# Patient Record
Sex: Female | Born: 1955 | Race: White | Hispanic: No | Marital: Single | Smoking: Current every day smoker
Health system: Southern US, Community
[De-identification: ages and names within clinical notes are randomized; demographics above are authoritative.]

## PROBLEM LIST (undated history)

## (undated) DIAGNOSIS — K219 Gastro-esophageal reflux disease without esophagitis: Secondary | ICD-10-CM

## (undated) DIAGNOSIS — F419 Anxiety disorder, unspecified: Secondary | ICD-10-CM

## (undated) DIAGNOSIS — F1020 Alcohol dependence, uncomplicated: Secondary | ICD-10-CM

## (undated) DIAGNOSIS — F32A Depression, unspecified: Secondary | ICD-10-CM

## (undated) DIAGNOSIS — F329 Major depressive disorder, single episode, unspecified: Secondary | ICD-10-CM

## (undated) HISTORY — PX: TUBAL LIGATION: SHX77

---

## 2013-07-20 ENCOUNTER — Emergency Department: Payer: Self-pay | Admitting: Emergency Medicine

## 2013-07-20 LAB — URINALYSIS, COMPLETE
Bacteria: NONE SEEN
Bilirubin,UR: NEGATIVE
Blood: NEGATIVE
Glucose,UR: NEGATIVE mg/dL (ref 0–75)
Ketone: NEGATIVE
Leukocyte Esterase: NEGATIVE
NITRITE: NEGATIVE
Ph: 8 (ref 4.5–8.0)
Protein: NEGATIVE
RBC, UR: NONE SEEN /HPF (ref 0–5)
SPECIFIC GRAVITY: 1.004 (ref 1.003–1.030)
Squamous Epithelial: <1
WBC UR: NONE SEEN /HPF (ref 0–5)

## 2013-07-20 LAB — BASIC METABOLIC PANEL
Anion Gap: 8 (ref 7–16)
BUN: 6 mg/dL — ABNORMAL LOW (ref 7–18)
CHLORIDE: 96 mmol/L — AB (ref 98–107)
CO2: 29 mmol/L (ref 21–32)
CREATININE: 0.4 mg/dL — AB (ref 0.60–1.30)
Calcium, Total: 9.5 mg/dL (ref 8.5–10.1)
EGFR (Non-African Amer.): >60
GLUCOSE: 78 mg/dL (ref 65–99)
Osmolality: 263 (ref 275–301)
POTASSIUM: 3.8 mmol/L (ref 3.5–5.1)
SODIUM: 133 mmol/L — AB (ref 136–145)

## 2013-07-20 LAB — LIPASE, BLOOD: Lipase: 214 U/L (ref 73–393)

## 2013-07-20 LAB — CBC
HCT: 45 % (ref 35.0–47.0)
HGB: 15.2 g/dL (ref 12.0–16.0)
MCH: 31.4 pg (ref 26.0–34.0)
MCHC: 33.8 g/dL (ref 32.0–36.0)
MCV: 93 fL (ref 80–100)
PLATELETS: 212 10*3/uL (ref 150–440)
RBC: 4.84 10*6/uL (ref 3.80–5.20)
RDW: 12.8 % (ref 11.5–14.5)
WBC: 5.4 10*3/uL (ref 3.6–11.0)

## 2013-07-20 LAB — HEPATIC FUNCTION PANEL A (ARMC)
ALK PHOS: 74 U/L
AST: 31 U/L (ref 15–37)
Albumin: 4.2 g/dL (ref 3.4–5.0)
Bilirubin, Direct: 0.1 mg/dL (ref 0.00–0.20)
Bilirubin,Total: 0.4 mg/dL (ref 0.2–1.0)
SGPT (ALT): 25 U/L (ref 12–78)
Total Protein: 7.1 g/dL (ref 6.4–8.2)

## 2013-07-20 LAB — TROPONIN I: Troponin-I: <0.02 ng/mL

## 2013-09-01 ENCOUNTER — Encounter (HOSPITAL_COMMUNITY): Payer: Self-pay | Admitting: Emergency Medicine

## 2013-09-01 DIAGNOSIS — F1020 Alcohol dependence, uncomplicated: Secondary | ICD-10-CM | POA: Insufficient documentation

## 2013-09-01 DIAGNOSIS — F1010 Alcohol abuse, uncomplicated: Secondary | ICD-10-CM | POA: Insufficient documentation

## 2013-09-01 DIAGNOSIS — F17200 Nicotine dependence, unspecified, uncomplicated: Secondary | ICD-10-CM | POA: Insufficient documentation

## 2013-09-01 NOTE — ED Notes (Signed)
Pt requesting detox from etoh.  Last drank etoh on the way to the hospital.  Denies suicidal ideation.  States she has not ate in the last 3 days.

## 2013-09-02 ENCOUNTER — Emergency Department (HOSPITAL_COMMUNITY)
Admission: EM | Admit: 2013-09-02 | Discharge: 2013-09-02 | Disposition: A | Discharge status: Other Acute Inpatient Hospital | Payer: Managed Care, Other (non HMO) | Attending: Emergency Medicine | Admitting: Emergency Medicine

## 2013-09-02 ENCOUNTER — Encounter (HOSPITAL_COMMUNITY): Payer: Self-pay

## 2013-09-02 ENCOUNTER — Inpatient Hospital Stay (HOSPITAL_COMMUNITY)
Admission: AD | Admit: 2013-09-02 | Discharge: 2013-09-05 | DRG: 897 | Disposition: A | Discharge status: Home / Self Care | Payer: Managed Care, Other (non HMO) | Source: Intra-hospital | Attending: Psychiatry | Admitting: Psychiatry

## 2013-09-02 DIAGNOSIS — K219 Gastro-esophageal reflux disease without esophagitis: Secondary | ICD-10-CM | POA: Diagnosis present

## 2013-09-02 DIAGNOSIS — F1020 Alcohol dependence, uncomplicated: Secondary | ICD-10-CM

## 2013-09-02 DIAGNOSIS — F10929 Alcohol use, unspecified with intoxication, unspecified: Secondary | ICD-10-CM

## 2013-09-02 DIAGNOSIS — F1010 Alcohol abuse, uncomplicated: Secondary | ICD-10-CM | POA: Diagnosis present

## 2013-09-02 DIAGNOSIS — F329 Major depressive disorder, single episode, unspecified: Secondary | ICD-10-CM | POA: Diagnosis present

## 2013-09-02 DIAGNOSIS — G4700 Insomnia, unspecified: Secondary | ICD-10-CM | POA: Diagnosis present

## 2013-09-02 DIAGNOSIS — F1994 Other psychoactive substance use, unspecified with psychoactive substance-induced mood disorder: Secondary | ICD-10-CM | POA: Diagnosis present

## 2013-09-02 DIAGNOSIS — F17200 Nicotine dependence, unspecified, uncomplicated: Secondary | ICD-10-CM | POA: Diagnosis present

## 2013-09-02 DIAGNOSIS — F411 Generalized anxiety disorder: Secondary | ICD-10-CM | POA: Diagnosis present

## 2013-09-02 LAB — COMPREHENSIVE METABOLIC PANEL
ALK PHOS: 87 U/L (ref 39–117)
ALT: 23 U/L (ref 0–35)
AST: 40 U/L — ABNORMAL HIGH (ref 0–37)
Albumin: 4.5 g/dL (ref 3.5–5.2)
BUN: 4 mg/dL — ABNORMAL LOW (ref 6–23)
CO2: 22 meq/L (ref 19–32)
Calcium: 9.7 mg/dL (ref 8.4–10.5)
Chloride: 88 mEq/L — ABNORMAL LOW (ref 96–112)
Creatinine, Ser: 0.5 mg/dL (ref 0.50–1.10)
GLUCOSE: 98 mg/dL (ref 70–99)
POTASSIUM: 4.1 meq/L (ref 3.7–5.3)
Sodium: 130 mEq/L — ABNORMAL LOW (ref 137–147)
TOTAL PROTEIN: 7.7 g/dL (ref 6.0–8.3)
Total Bilirubin: 0.4 mg/dL (ref 0.3–1.2)

## 2013-09-02 LAB — RAPID URINE DRUG SCREEN, HOSP PERFORMED
Amphetamines: NOT DETECTED
Barbiturates: NOT DETECTED
Benzodiazepines: NOT DETECTED
COCAINE: NOT DETECTED
Opiates: NOT DETECTED
TETRAHYDROCANNABINOL: NOT DETECTED

## 2013-09-02 LAB — CBC
HCT: 42.3 % (ref 36.0–46.0)
HEMOGLOBIN: 15.2 g/dL — AB (ref 12.0–15.0)
MCH: 32.2 pg (ref 26.0–34.0)
MCHC: 35.9 g/dL (ref 30.0–36.0)
MCV: 89.6 fL (ref 78.0–100.0)
PLATELETS: 242 10*3/uL (ref 150–400)
RBC: 4.72 MIL/uL (ref 3.87–5.11)
RDW: 12.5 % (ref 11.5–15.5)
WBC: 5.7 10*3/uL (ref 4.0–10.5)

## 2013-09-02 LAB — SALICYLATE LEVEL: Salicylate Lvl: <2 mg/dL — ABNORMAL LOW (ref 2.8–20.0)

## 2013-09-02 LAB — ACETAMINOPHEN LEVEL

## 2013-09-02 LAB — ETHANOL: Alcohol, Ethyl (B): 263 mg/dL — ABNORMAL HIGH (ref 0–11)

## 2013-09-02 MED ORDER — CHLORDIAZEPOXIDE HCL 25 MG PO CAPS
25.0000 mg | ORAL_CAPSULE | ORAL | Status: AC
  Administered 2013-09-04 – 2013-09-05 (×2): 25 mg via ORAL
  Filled 2013-09-02 (×2): qty 1

## 2013-09-02 MED ORDER — CHLORDIAZEPOXIDE HCL 25 MG PO CAPS
25.0000 mg | ORAL_CAPSULE | Freq: Three times a day (TID) | ORAL | Status: AC
  Administered 2013-09-03 – 2013-09-04 (×3): 25 mg via ORAL
  Filled 2013-09-02 (×3): qty 1

## 2013-09-02 MED ORDER — TRAZODONE HCL 50 MG PO TABS: 50.0000 mg | ORAL_TABLET | Freq: Every evening | ORAL | Status: DC | PRN

## 2013-09-02 MED ORDER — SUCRALFATE 1 G PO TABS
1.0000 g | ORAL_TABLET | Freq: Three times a day (TID) | ORAL | Status: DC | PRN
  Filled 2013-09-02: qty 1

## 2013-09-02 MED ORDER — NICOTINE 21 MG/24HR TD PT24: 21.0000 mg | MEDICATED_PATCH | Freq: Every day | TRANSDERMAL | Status: DC

## 2013-09-02 MED ORDER — PANTOPRAZOLE SODIUM 40 MG PO TBEC: 40.0000 mg | DELAYED_RELEASE_TABLET | Freq: Every day | ORAL | Status: DC | PRN

## 2013-09-02 MED ORDER — ALUM & MAG HYDROXIDE-SIMETH 200-200-20 MG/5ML PO SUSP: 30.0000 mL | ORAL | Status: DC | PRN

## 2013-09-02 MED ORDER — ONDANSETRON HCL 4 MG PO TABS: 4.0000 mg | ORAL_TABLET | Freq: Three times a day (TID) | ORAL | Status: DC | PRN

## 2013-09-02 MED ORDER — MAGNESIUM HYDROXIDE 400 MG/5ML PO SUSP: 30.0000 mL | Freq: Every day | ORAL | Status: DC | PRN

## 2013-09-02 MED ORDER — LORAZEPAM 1 MG PO TABS: 0.0000 mg | ORAL_TABLET | Freq: Two times a day (BID) | ORAL | Status: DC

## 2013-09-02 MED ORDER — PANTOPRAZOLE SODIUM 40 MG PO TBEC
40.0000 mg | DELAYED_RELEASE_TABLET | Freq: Every day | ORAL | Status: DC | PRN
  Administered 2013-09-02: 40 mg via ORAL
  Filled 2013-09-02: qty 1

## 2013-09-02 MED ORDER — ONDANSETRON 4 MG PO TBDP
4.0000 mg | ORAL_TABLET | Freq: Four times a day (QID) | ORAL | Status: AC | PRN
  Administered 2013-09-02: 4 mg via ORAL
  Filled 2013-09-02: qty 1

## 2013-09-02 MED ORDER — VITAMIN B-1 100 MG PO TABS
100.0000 mg | ORAL_TABLET | Freq: Every day | ORAL | Status: DC
  Administered 2013-09-03 – 2013-09-05 (×3): 100 mg via ORAL
  Filled 2013-09-02 (×5): qty 1

## 2013-09-02 MED ORDER — GABAPENTIN 100 MG PO CAPS
100.0000 mg | ORAL_CAPSULE | Freq: Three times a day (TID) | ORAL | Status: DC
  Administered 2013-09-02 – 2013-09-05 (×10): 100 mg via ORAL
  Filled 2013-09-02 (×12): qty 1
  Filled 2013-09-02: qty 12
  Filled 2013-09-02: qty 1
  Filled 2013-09-02 (×2): qty 12
  Filled 2013-09-02: qty 1

## 2013-09-02 MED ORDER — ADULT MULTIVITAMIN W/MINERALS CH
1.0000 | ORAL_TABLET | Freq: Every day | ORAL | Status: DC
  Administered 2013-09-02 – 2013-09-05 (×4): 1 via ORAL
  Filled 2013-09-02 (×6): qty 1

## 2013-09-02 MED ORDER — ACETAMINOPHEN 325 MG PO TABS
650.0000 mg | ORAL_TABLET | Freq: Four times a day (QID) | ORAL | Status: DC | PRN
Start: 2013-09-02 — End: 2013-09-05

## 2013-09-02 MED ORDER — THIAMINE HCL 100 MG/ML IJ SOLN: 100.0000 mg | Freq: Every day | INTRAMUSCULAR | Status: DC

## 2013-09-02 MED ORDER — METHOCARBAMOL 500 MG PO TABS
500.0000 mg | ORAL_TABLET | Freq: Three times a day (TID) | ORAL | Status: AC
  Administered 2013-09-02 – 2013-09-05 (×9): 500 mg via ORAL
  Filled 2013-09-02 (×11): qty 1

## 2013-09-02 MED ORDER — THIAMINE HCL 100 MG/ML IJ SOLN: 100.0000 mg | Freq: Once | INTRAMUSCULAR | Status: DC

## 2013-09-02 MED ORDER — HYDROXYZINE HCL 25 MG PO TABS
25.0000 mg | ORAL_TABLET | Freq: Four times a day (QID) | ORAL | Status: AC | PRN
  Administered 2013-09-02 – 2013-09-03 (×2): 25 mg via ORAL
  Filled 2013-09-02 (×2): qty 1

## 2013-09-02 MED ORDER — LORAZEPAM 1 MG PO TABS
0.0000 mg | ORAL_TABLET | Freq: Four times a day (QID) | ORAL | Status: DC
  Administered 2013-09-02: 1 mg via ORAL
  Filled 2013-09-02: qty 1

## 2013-09-02 MED ORDER — LOPERAMIDE HCL 2 MG PO CAPS: 2.0000 mg | ORAL_CAPSULE | ORAL | Status: AC | PRN

## 2013-09-02 MED ORDER — CHLORDIAZEPOXIDE HCL 25 MG PO CAPS: 25.0000 mg | ORAL_CAPSULE | Freq: Every day | ORAL | Status: DC

## 2013-09-02 MED ORDER — TETRAHYDROZOLINE HCL 0.05 % OP SOLN
1.0000 [drp] | Freq: Two times a day (BID) | OPHTHALMIC | Status: DC
  Administered 2013-09-03 – 2013-09-05 (×5): 1 [drp] via OPHTHALMIC
  Filled 2013-09-02: qty 15

## 2013-09-02 MED ORDER — ZOLPIDEM TARTRATE 5 MG PO TABS: 5.0000 mg | ORAL_TABLET | Freq: Every evening | ORAL | Status: DC | PRN

## 2013-09-02 MED ORDER — TRAZODONE HCL 50 MG PO TABS
75.0000 mg | ORAL_TABLET | Freq: Every day | ORAL | Status: DC
  Administered 2013-09-02 – 2013-09-03 (×2): 75 mg via ORAL
  Filled 2013-09-02 (×8): qty 1

## 2013-09-02 MED ORDER — NICOTINE 21 MG/24HR TD PT24
21.0000 mg | MEDICATED_PATCH | Freq: Every day | TRANSDERMAL | Status: DC
  Administered 2013-09-03 – 2013-09-05 (×3): 21 mg via TRANSDERMAL
  Filled 2013-09-02 (×6): qty 1

## 2013-09-02 MED ORDER — CHLORDIAZEPOXIDE HCL 25 MG PO CAPS
25.0000 mg | ORAL_CAPSULE | Freq: Four times a day (QID) | ORAL | Status: AC | PRN
  Administered 2013-09-02 – 2013-09-03 (×3): 25 mg via ORAL
  Filled 2013-09-02 (×4): qty 1

## 2013-09-02 MED ORDER — IBUPROFEN 400 MG PO TABS: 600.0000 mg | ORAL_TABLET | Freq: Three times a day (TID) | ORAL | Status: DC | PRN

## 2013-09-02 MED ORDER — SODIUM CHLORIDE 0.9 % IV BOLUS (SEPSIS)
1000.0000 mL | Freq: Once | INTRAVENOUS | Status: AC
  Administered 2013-09-02: 1000 mL via INTRAVENOUS

## 2013-09-02 MED ORDER — VITAMIN B-1 100 MG PO TABS: 100.0000 mg | ORAL_TABLET | Freq: Every day | ORAL | Status: DC

## 2013-09-02 MED ORDER — CHLORDIAZEPOXIDE HCL 25 MG PO CAPS
25.0000 mg | ORAL_CAPSULE | Freq: Four times a day (QID) | ORAL | Status: AC
  Administered 2013-09-02 – 2013-09-03 (×4): 25 mg via ORAL
  Filled 2013-09-02 (×4): qty 1
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
## 2013-09-02 NOTE — Progress Notes (Signed)
Psychoeducational Group Note  Date:  09/02/2013 Time:  1315  Group Topic/Focus:  Identifying Needs:   The focus of this group is to help patients identify their personal needs that have been historically problematic and identify healthy behaviors to address their needs.  Participation Level:  Did Not Attend   Migdalia Dk 09/02/2013,3:26 PM

## 2013-09-02 NOTE — Progress Notes (Signed)
D.  Pt pleasant on approach, anxious affect.  Requested medication for sleep.  Positive for evening wrap up group, interacting appropriately with peers on unit.  Denies SI/HI/hallucinations at this time.  A.  Support and encouragement offered, medication given as ordered for insomnia and withdrawal symptoms.  R.  Pt remains safe on unit, will continue to monitor.

## 2013-09-02 NOTE — BH Assessment (Signed)
Assessment completed. Consulted with Serena Colonel who recommends inpatient treatment. Pt has been accepted to [facility] 300 Bed 2. Hazel Sams, PA-C has been notified of the recommendation and acceptance to [facility]. Pt can be transported after 7:30 am.

## 2013-09-02 NOTE — Progress Notes (Signed)
Pt is a 58 year old female admitted with ETOH dependence and some depression without suicidal ideation   She requests detox and said she is unable to stop drinking on her own  However she has had 3 to 4 months of sobriety before   She presents as sad but cooperative  She is having some stomach discomfort and some tremors with increased anxiety   She lives in an RV parked in her daughters yard and can go back there   She refused her pneumonia shot but said she woud think about getting it  And refused her thiamine shot     Pt was admitted to the 300 hall and oriented to the unit  She was offered nourishment  Medications administered and effectiveness monitored   Will continue to evaluate her withdrawal symptoms and educate on same   Provided verbal support and encouragement   Q 15 min checks explained and implemented   Pt is presently safe

## 2013-09-02 NOTE — Progress Notes (Signed)
Adult Psychoeducational Group Note  Date:  09/02/2013 Time:  3:40 PM  Group Topic/Focus:  Therapeutic Activity   Participation Level:  Active  Participation Quality:  Appropriate, Sharing and Supportive  Affect:  Appropriate  Cognitive:  Appropriate  Insight: Appropriate  Engagement in Group:  Engaged  Modes of Intervention:  Activity  Elisha Headland 09/02/2013, 3:40 PM

## 2013-09-02 NOTE — ED Notes (Signed)
Pt states last drink of beer was last night. States she "was drinking too much it was turning her life to shit so she wanted to turn her life around"

## 2013-09-02 NOTE — ED Provider Notes (Signed)
CSN: [id number]     Arrival date & time 09/01/13  2329 History   First MD Initiated Contact with Patient 09/02/13 0148     Chief Complaint  Patient presents with  . Alcohol Problem   HPI  History provided by the patient and daughter. The patient is a 58 year old female with history of alcohol abuse presenting with request for help with alcohol detox. Patient is a heavy daily drinker mostly beer. She reports that previously undergoing detox and alcohol addiction therapy 2-3 years ago fall was only sober for a few months. She denies any other drug use. She reports having poor appetite over the past several days with occasional episodes of vomiting. She denies any past history of significant withdrawal symptoms or hospitalizations. Denies any recent illnesses. No recent injuries or falls. She denies any SI or HI.    History reviewed. No pertinent past medical history. Past Surgical History  Procedure Laterality Date  . Tubal ligation     No family history on file. History  Substance Use Topics  . Smoking status: Current Every Day Smoker  . Smokeless tobacco: Not on file  . Alcohol Use: Yes   OB History   Grav Para Term Preterm Abortions TAB SAB Ect Mult Living                 Review of Systems  All other systems reviewed and are negative.     Allergies  Compazine  Home Medications   Prior to Admission medications   Medication Sig Start Date End Date Taking? Authorizing Provider  pantoprazole (PROTONIX) 40 MG tablet Take 40 mg by mouth daily as needed (acid reflux).   Yes Historical Provider, MD  sucralfate (CARAFATE) 1 G tablet Take 1 g by mouth 3 (three) times daily as needed (acid).   Yes Historical Provider, MD  Tetrahydrozoline HCl (VISINE OP) Place 2 drops into both eyes daily as needed (only on working days).   Yes Historical Provider, MD   BP 137/82  Pulse 88  Temp(Src) 97.9 F (36.6 C) (Oral)  Resp 12  Ht 5\' 4"  (1.626 m)  Wt 120 lb (54.432 kg)  BMI 20.59  kg/m2  SpO2 96% Physical Exam  Nursing note and vitals reviewed. Constitutional: She is oriented to person, place, and time. She appears well-developed and well-nourished. No distress.  HENT:  Head: Normocephalic.  Cardiovascular: Normal rate and regular rhythm.   Pulmonary/Chest: Effort normal and breath sounds normal. No respiratory distress.  Abdominal: Soft.  Musculoskeletal: Normal range of motion.  Neurological: She is alert and oriented to person, place, and time.  Skin: Skin is warm and dry. No rash noted.  Psychiatric: She has a normal mood and affect. Her behavior is normal.    ED Course  Procedures   COORDINATION OF CARE:  Nursing notes reviewed. Vital signs reviewed. Initial pt interview and examination performed.   Filed Vitals:   09/01/13 2340 09/02/13 0108 09/02/13 0139  BP: 167/88 143/83 137/82  Pulse: 113  88  Temp: 97.6 F (36.4 C) 97.9 F (36.6 C)   TempSrc: Oral Oral   Resp: 20 12   Height: 5\' 4"  (1.626 m)    Weight: 120 lb (54.432 kg)    SpO2: 98% 96%     1:59 AM-patient seen and evaluated. She is well-appearing in no acute distress.  Patient appears slightly dehydrated. IV fluids given. Laboratory testing otherwise on concerning. Patient is medically cleared and able for further psychiatric and addiction treatment.   Patient  was assessed by [facility]TS and has been accepted at [facility] under Dr. Sabra Heck. Patient stable for transfer at this time.   Results for orders placed during the hospital encounter of 09/02/13  ACETAMINOPHEN LEVEL      Result Value Ref Range   Acetaminophen (Tylenol), Serum <15.0  10 - 30 ug/mL  CBC      Result Value Ref Range   WBC 5.7  4.0 - 10.5 K/uL   RBC 4.72  3.87 - 5.11 MIL/uL   Hemoglobin 15.2 (*) 12.0 - 15.0 g/dL   HCT 42.3  36.0 - 46.0 %   MCV 89.6  78.0 - 100.0 fL   MCH 32.2  26.0 - 34.0 pg   MCHC 35.9  30.0 - 36.0 g/dL   RDW 12.5  11.5 - 15.5 %   Platelets 242  150 - 400 K/uL  COMPREHENSIVE METABOLIC PANEL      Result  Value Ref Range   Sodium 130 (*) 137 - 147 mEq/L   Potassium 4.1  3.7 - 5.3 mEq/L   Chloride 88 (*) 96 - 112 mEq/L   CO2 22  19 - 32 mEq/L   Glucose, Bld 98  70 - 99 mg/dL   BUN 4 (*) 6 - 23 mg/dL   Creatinine, Ser 0.50  0.50 - 1.10 mg/dL   Calcium 9.7  8.4 - 10.5 mg/dL   Total Protein 7.7  6.0 - 8.3 g/dL   Albumin 4.5  3.5 - 5.2 g/dL   AST 40 (*) 0 - 37 U/L   ALT 23  0 - 35 U/L   Alkaline Phosphatase 87  39 - 117 U/L   Total Bilirubin 0.4  0.3 - 1.2 mg/dL   GFR calc non Af Amer >90  >90 mL/min   GFR calc Af Amer >90  >90 mL/min  ETHANOL      Result Value Ref Range   Alcohol, Ethyl (B) 263 (*) 0 - 11 mg/dL  SALICYLATE LEVEL      Result Value Ref Range   Salicylate Lvl <1.6 (*) 2.8 - 20.0 mg/dL  URINE RAPID DRUG SCREEN (HOSP PERFORMED)      Result Value Ref Range   Opiates NONE DETECTED  NONE DETECTED   Cocaine NONE DETECTED  NONE DETECTED   Benzodiazepines NONE DETECTED  NONE DETECTED   Amphetamines NONE DETECTED  NONE DETECTED   Tetrahydrocannabinol NONE DETECTED  NONE DETECTED   Barbiturates NONE DETECTED  NONE DETECTED    MDM   Final diagnoses:  Alcohol addiction  Alcohol intoxication       Martie Lee, PA-C 09/02/13 [phone]

## 2013-09-02 NOTE — ED Provider Notes (Signed)
Medical screening examination/treatment/procedure(s) were performed by non-physician practitioner and as supervising physician I was immediately available for consultation/collaboration.   EKG Interpretation None       Kalman Drape, MD 09/02/13 [phone]

## 2013-09-02 NOTE — [facility] Suicide Risk Assessment (Signed)
   Nursing information obtained from:    Demographic factors:    Current Mental Status:    Loss Factors:    Historical Factors:    Risk Reduction Factors:    Total Time spent with patient: 20 minutes  CLINICAL FACTORS:   Severe Anxiety and/or Agitation Alcohol/Substance Abuse/Dependencies Unstable or Poor Therapeutic Relationship  Psychiatric Specialty Exam: Physical Exam  Psychiatric: She has a normal mood and affect. Her speech is normal and behavior is normal. Thought content normal. Cognition and memory are normal. She expresses impulsivity.    Review of Systems  Constitutional: Positive for malaise/fatigue and diaphoresis.  HENT: Negative.   Eyes: Negative.   Respiratory: Negative.   Cardiovascular: Negative.   Gastrointestinal: Positive for nausea.  Genitourinary: Negative.   Musculoskeletal: Negative.   Skin: Negative.   Neurological: Positive for tremors and weakness.  Endo/Heme/Allergies: Negative.   Psychiatric/Behavioral: Positive for substance abuse. The patient is nervous/anxious and has insomnia.     Blood pressure 133/77, pulse 134, temperature 99.4 F (37.4 C), temperature source Oral, resp. rate 18, height 5\' 4"  (1.626 m), weight 56.246 kg (124 lb).Body mass index is 21.27 kg/(m^2).  General Appearance: Fairly Groomed  Engineer, water::  Good  Speech:  Clear and Coherent and Normal Rate  Volume:  Normal  Mood:  Anxious  Affect:  anxious  Thought Process:  Goal Directed  Orientation:  Full (Time, Place, and Person)  Thought Content:  Negative  Suicidal Thoughts:  No  Homicidal Thoughts:  No  Memory:  Immediate;   Fair Recent;   Fair Remote;   Fair  Judgement:  Impaired  Insight:  Shallow  Psychomotor Activity:  Decreased  Concentration:  Fair  Recall:  Good  Fund of Knowledge:Good  Language: Good  Akathisia:  No  Handed:  Right  AIMS (if indicated):     Assets:  Communication Skills Desire for Improvement Physical Health  Sleep:       Musculoskeletal: Strength & Muscle Tone: within normal limits Gait & Station: normal Patient leans: N/A  COGNITIVE FEATURES THAT CONTRIBUTE TO RISK:  Closed-mindedness    SUICIDE RISK:   Minimal: No identifiable suicidal ideation.  Patients presenting with no risk factors but with morbid ruminations; may be classified as minimal risk based on the severity of the depressive symptoms  PLAN OF CARE:1. Admit for crisis management and stabilization. 2. Medication management to reduce current symptoms to base line and improve the     patient's overall level of functioning 3. Treat health problems as indicated. 4. Develop treatment plan to decrease risk of relapse upon discharge and the need for     readmission. 5. Psycho-social education regarding relapse prevention and self care. 6. Health care follow up as needed for medical problems. 7. Restart home medications where appropriate.   I certify that inpatient services furnished can reasonably be expected to improve the patient's condition.  Corena Pilgrim, MD 09/02/2013, 10:06 AM

## 2013-09-02 NOTE — BH Assessment (Signed)
Spoke with Hazel Sams, PA-C who stated that the patient is requesting alcohol detox. Pt is denying SI, HI, AH and VH at this time. No history of seizures have been reported.

## 2013-09-02 NOTE — H&P (Signed)
Psychiatric Admission Assessment Adult  Patient Identification:  Brandi Gonzales  Date of Evaluation:  09/02/2013  Chief Complaint:  ETOH INTOXICATION  History of Present Illness: Brandi Gonzales is 58 year old. Caucasian female. Admitted from the [facility]. She reports, "My daughter took me to the hospital because I have been drinking too much alcohol. I can do 6-8 cans of beer everyday. I have been doing this for a long time. I'm an alcoholic. I drink because of high stress levels, both personal and occupational. Everything about my life is stressful. Alcohol calms me when tensed. My longest sobriety was 6 months, that was 3 years ago. I relapsed because of stress".  Brandi Gonzales is alert and about. She appears older than stated age. She says she is highly anxious, tired, nauseated and having muscle cramps today. She asked for help with alcoholism. It is affecting her life in a negative way.  Elements:  Location:  Alcohol dependence. Quality:  Fatigue, nausea, abdominal cramps, muscle cramps. Severity:  Severe, "I drink a lot of alcohol everyday". Timing:  "Drinking heavily in the last 3 years". Duration:  Chronic. Context:  "A lot of personal stress, stress from work, alcohol calms me".  Associated Signs/Synptoms:  Depression Symptoms:  depressed mood, insomnia, feelings of worthlessness/guilt, hopelessness, anxiety,  (Hypo) Manic Symptoms:  Impulsivity,  Anxiety Symptoms:  Excessive Worry,  Psychotic Symptoms:  Denies  PTSD Symptoms: NA  Psychiatric Specialty Exam: Physical Exam  Constitutional: She is oriented to person, place, and time. She appears well-developed.  HENT:  Head: Normocephalic.  Eyes: Pupils are equal, round, and reactive to light.  Neck: Normal range of motion.  Cardiovascular: Normal rate.   Respiratory: Effort normal.  GI: Soft.  Genitourinary:  Denies any intent and or plans  Musculoskeletal: Normal range of motion.  Neurological: She is alert and  oriented to person, place, and time.  Skin: Skin is warm and dry.    Review of Systems  Constitutional: Positive for chills, malaise/fatigue and diaphoresis.  HENT: Negative.   Eyes: Negative.   Respiratory: Negative.   Cardiovascular: Negative.   Gastrointestinal: Positive for nausea and abdominal pain.  Genitourinary: Negative.   Skin: Negative.   Neurological: Positive for dizziness, tremors and weakness.  Endo/Heme/Allergies: Negative.   Psychiatric/Behavioral: Negative for depression, suicidal ideas, hallucinations and memory loss. Substance abuse: Alcoholism, chronic. The patient is nervous/anxious and has insomnia.     There were no vitals taken for this visit.There is no weight on file to calculate BMI.  General Appearance: Disheveled  Eye Sport and exercise psychologist::  Fair  Speech:  Clear and Coherent  Volume:  Normal  Mood:  Anxious, Depressed and Hopeless  Affect:  Flat  Thought Process:  Coherent and Goal Directed  Orientation:  Full (Time, Place, and Person)  Thought Content:  Denies any hallucinations or delusions  Suicidal Thoughts:  No  Homicidal Thoughts:  No  Memory:  Immediate;   Fair Recent;   Fair Remote;   Fair  Judgement:  Fair  Insight:  Present  Psychomotor Activity:  Restlessness and Tremor  Concentration:  Fair  Recall:  Good  Fund of Knowledge:Fair  Language: Fair  Akathisia:  No  Handed:  Right  AIMS (if indicated):     Assets:  Communication Skills Desire for Improvement  Sleep:      Musculoskeletal: Strength & Muscle Tone: within normal limits Gait & Station: normal Patient leans: N/A  Past Psychiatric History: Diagnosis: Alcohol dependence  Hospitalizations: [facility] adult unit  Outpatient Care: With  Dr. Ovid Curd, says "I don't care much about this guy"  Substance Abuse Care: In [facility] a long time ago  Self-Mutilation: Denies  Suicidal Attempts: Denies  Violent Behaviors: Denies   Past Medical History:  No past medical history on  file. None.  Allergies:   Allergies  Allergen Reactions  . Compazine [Prochlorperazine] Other (See Comments)    Makes eyes roll into the back of head   PTA Medications: Prescriptions prior to admission  Medication Sig Dispense Refill  . pantoprazole (PROTONIX) 40 MG tablet Take 40 mg by mouth daily as needed (acid reflux).      . sucralfate (CARAFATE) 1 G tablet Take 1 g by mouth 3 (three) times daily as needed (acid).      . Tetrahydrozoline HCl (VISINE OP) Place 2 drops into both eyes daily as needed (only on working days).        Previous Psychotropic Medications:  Medication/Dose  See medication lists               Substance Abuse History in the last 12 months:  yes  Consequences of Substance Abuse: Medical Consequences:  Liver damage, Possible death by overdose Legal Consequences:  Arrests, jail time, Loss of driving privilege. Family Consequences:  Family discord, divorce and or separation.  Social History:  reports that she has been smoking.  She does not have any smokeless tobacco history on file. She reports that she drinks alcohol. She reports that she does not use illicit drugs. 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  Legal History: Denies any pending legal charges  Hobbies/Interests: NA  Family History:  No family history on file.  Results for orders placed during the hospital encounter of 09/02/13 (from the past 72 hour(s))  ACETAMINOPHEN LEVEL     Status: None   Collection Time    09/02/13 12:15 AM      Result Value Ref Range    Acetaminophen (Tylenol), Serum <15.0  10 - 30 ug/mL   Comment:            THERAPEUTIC CONCENTRATIONS VARY     SIGNIFICANTLY. A RANGE OF 10-30     ug/mL MAY BE AN EFFECTIVE     CONCENTRATION FOR MANY PATIENTS.     HOWEVER, SOME ARE BEST TREATED     AT CONCENTRATIONS OUTSIDE THIS     RANGE.     ACETAMINOPHEN CONCENTRATIONS     >150 ug/mL AT 4 HOURS AFTER     INGESTION AND >50 ug/mL AT 12     HOURS AFTER INGESTION ARE     OFTEN ASSOCIATED WITH TOXIC     REACTIONS.  CBC     Status: Abnormal   Collection Time    09/02/13 12:15 AM      Result Value Ref Range   WBC 5.7  4.0 - 10.5 K/uL   RBC 4.72  3.87 - 5.11 MIL/uL   Hemoglobin 15.2 (*) 12.0 - 15.0 g/dL   HCT 42.3  36.0 - 46.0 %   MCV 89.6  78.0 - 100.0 fL   MCH 32.2  26.0 - 34.0 pg   MCHC 35.9  30.0 -  36.0 g/dL   RDW 12.5  11.5 - 15.5 %   Platelets 242  150 - 400 K/uL  COMPREHENSIVE METABOLIC PANEL     Status: Abnormal   Collection Time    09/02/13 12:15 AM      Result Value Ref Range   Sodium 130 (*) 137 - 147 mEq/L   Potassium 4.1  3.7 - 5.3 mEq/L   Chloride 88 (*) 96 - 112 mEq/L   CO2 22  19 - 32 mEq/L   Glucose, Bld 98  70 - 99 mg/dL   BUN 4 (*) 6 - 23 mg/dL   Creatinine, Ser 0.50  0.50 - 1.10 mg/dL   Calcium 9.7  8.4 - 10.5 mg/dL   Total Protein 7.7  6.0 - 8.3 g/dL   Albumin 4.5  3.5 - 5.2 g/dL   AST 40 (*) 0 - 37 U/L   ALT 23  0 - 35 U/L   Alkaline Phosphatase 87  39 - 117 U/L   Total Bilirubin 0.4  0.3 - 1.2 mg/dL   GFR calc non Af Amer >90  >90 mL/min   GFR calc Af Amer >90  >90 mL/min   Comment: (NOTE)     The eGFR has been calculated using the CKD EPI equation.     This calculation has not been validated in all clinical situations.     eGFR's persistently <90 mL/min signify possible Chronic Kidney     Disease.  ETHANOL     Status: Abnormal   Collection Time    09/02/13 12:15 AM      Result Value Ref Range   Alcohol, Ethyl (B) 263 (*) 0 - 11 mg/dL   Comment:            LOWEST DETECTABLE LIMIT FOR      SERUM ALCOHOL IS 11 mg/dL     FOR MEDICAL PURPOSES ONLY  SALICYLATE LEVEL     Status: Abnormal   Collection Time    09/02/13 12:15 AM      Result Value Ref Range   Salicylate Lvl <6.5 (*) 2.8 - 20.0 mg/dL  URINE RAPID DRUG SCREEN (HOSP PERFORMED)     Status: None   Collection Time    09/02/13  1:17 AM      Result Value Ref Range   Opiates NONE DETECTED  NONE DETECTED   Cocaine NONE DETECTED  NONE DETECTED   Benzodiazepines NONE DETECTED  NONE DETECTED   Amphetamines NONE DETECTED  NONE DETECTED   Tetrahydrocannabinol NONE DETECTED  NONE DETECTED   Barbiturates NONE DETECTED  NONE DETECTED   Comment:            DRUG SCREEN FOR MEDICAL PURPOSES     ONLY.  IF CONFIRMATION IS NEEDED     FOR ANY PURPOSE, NOTIFY LAB     WITHIN 5 DAYS.                LOWEST DETECTABLE LIMITS     FOR URINE DRUG SCREEN     Drug Class       Cutoff (ng/mL)     Amphetamine      1000     Barbiturate      200     Benzodiazepine   465     Tricyclics       035     Opiates          300     Cocaine          300  THC              50   Psychological Evaluations:  Assessment:   DSM5: Schizophrenia Disorders:  NA Obsessive-Compulsive Disorders:  NA Trauma-Stressor Disorders:  NA Substance/Addictive Disorders:  Alcohol Related Disorder - Severe (303.90) Depressive Disorders:  Substance induced mood disorder  AXIS I:  Alcohol dependence  AXIS II:  Deferred  AXIS III:  No past medical history on file.  AXIS IV:  other psychosocial or environmental problems and Alcoholism, chronic  AXIS V:  1-10 persistent dangerousness to self and others present  Treatment Plan/Recommendations: 1. Admit for crisis management and stabilization, estimated length of stay 3-5 days.  2. Medication management to reduce current symptoms to base line and improve the patient's overall level of functioning; continue Librium detox protocols already in progress. Will add Robaxin 500 mg tid x 3 days for muscle cramps, Neurontin  100 mg tid for substance withdrawal sydrome. 3. Treat health problems as indicated.  4. Develop treatment plan to decrease risk of relapse upon discharge and the need for readmission.  5. Psycho-social education regarding relapse prevention and self care.  6. Health care follow up as needed for medical problems.  7. Review, reconcile, and reinstate any pertinent home medications for other health issues where appropriate. 8. Call for consults with hospitalist for any additional specialty patient care services as needed.  Treatment Plan Summary: Daily contact with patient to assess and evaluate symptoms and progress in treatment  Current Medications:  Current Facility-Administered Medications  Medication Dose Route Frequency Provider Last Rate Last Dose  . acetaminophen (TYLENOL) tablet 650 mg  650 mg Oral Q6H PRN Encarnacion Slates, NP      . alum & mag hydroxide-simeth (MAALOX/MYLANTA) 200-200-20 MG/5ML suspension 30 mL  30 mL Oral Q4H PRN Encarnacion Slates, NP      . chlordiazePOXIDE (LIBRIUM) capsule 25 mg  25 mg Oral Q6H PRN Encarnacion Slates, NP      . chlordiazePOXIDE (LIBRIUM) capsule 25 mg  25 mg Oral QID Encarnacion Slates, NP       Followed by  . [START ON 09/03/2013] chlordiazePOXIDE (LIBRIUM) capsule 25 mg  25 mg Oral TID Encarnacion Slates, NP       Followed by  . [START ON 09/04/2013] chlordiazePOXIDE (LIBRIUM) capsule 25 mg  25 mg Oral BH-qamhs Encarnacion Slates, NP       Followed by  . [START ON 09/06/2013] chlordiazePOXIDE (LIBRIUM) capsule 25 mg  25 mg Oral Daily Encarnacion Slates, NP      . hydrOXYzine (ATARAX/VISTARIL) tablet 25 mg  25 mg Oral Q6H PRN Encarnacion Slates, NP      . loperamide (IMODIUM) capsule 2-4 mg  2-4 mg Oral PRN Encarnacion Slates, NP      . magnesium hydroxide (MILK OF MAGNESIA) suspension 30 mL  30 mL Oral Daily PRN Encarnacion Slates, NP      . multivitamin with minerals tablet 1 tablet  1 tablet Oral Daily Encarnacion Slates, NP      . ondansetron (ZOFRAN-ODT) disintegrating tablet 4 mg  4 mg Oral  Q6H PRN Encarnacion Slates, NP      . pantoprazole (PROTONIX) EC tablet 40 mg  40 mg Oral Daily PRN Encarnacion Slates, NP      . tetrahydrozoline 0.05 % ophthalmic solution 1 drop  1 drop Both Eyes BID Encarnacion Slates, NP      . thiamine (B-1) injection 100 mg  100 mg Intramuscular Once Encarnacion Slates, NP      . Derrill Memo ON 09/03/2013] thiamine (VITAMIN B-1) tablet 100 mg  100 mg Oral Daily Encarnacion Slates, NP      . traZODone (DESYREL) tablet 50 mg  50 mg Oral QHS PRN Encarnacion Slates, NP        Observation Level/Precautions:  15 minute checks  Laboratory:  Per ED  Psychotherapy: Group sessions   Medications: See medication lists   Consultations: As needed   Discharge Concerns: Maintaining sobriety   Estimated LOS: 2-4 days   Other:     I certify that inpatient services furnished can reasonably be expected to improve the patient's condition.   Encarnacion Slates, PMHNP-BC 6/13/20159:48 AM   Patient seen, evaluated and I agree with notes by Nurse Practitioner. Corena Pilgrim, MD

## 2013-09-02 NOTE — [facility] Group Notes (Signed)
[facility] Group Notes: (Clinical Social Work)   09/02/2013      Type of Therapy:  Group Therapy   Participation Level:  Did Not Attend    Selmer Dominion, LCSW 09/02/2013, 12:09 PM

## 2013-09-02 NOTE — ED Notes (Signed)
Pt has been placed in blue scrubs and security paged for wanding

## 2013-09-02 NOTE — Tx Team (Addendum)
Initial Interdisciplinary Treatment Plan  PATIENT STRENGTHS: (choose at least two) Average or above average intelligence Capable of independent living General fund of knowledge Supportive family/friends  PATIENT STRESSORS: Medication change or noncompliance Substance abuse   PROBLEM LIST: Problem List/Patient Goals Date to be addressed Date deferred Reason deferred Estimated date of resolution  ETOH Dependence                                                       DISCHARGE CRITERIA:  Ability to meet basic life and health needs Improved stabilization in mood, thinking, and/or behavior Need for constant or close observation no longer present Withdrawal symptoms are absent or subacute and managed without 24-hour nursing intervention  PRELIMINARY DISCHARGE PLAN: Attend aftercare/continuing care group Attend 12-step recovery group Return to previous living arrangement  PATIENT/FAMIILY INVOLVEMENT: This treatment plan has been presented to and reviewed with the patient, Kaveri Perras, and/or family member, .  The patient and family have been given the opportunity to ask questions and make suggestions.  Migdalia Dk 09/02/2013, 10:14 AM

## 2013-09-03 MED ORDER — PANTOPRAZOLE SODIUM 40 MG PO TBEC
40.0000 mg | DELAYED_RELEASE_TABLET | Freq: Every day | ORAL | Status: DC
  Administered 2013-09-04 – 2013-09-05 (×2): 40 mg via ORAL
  Filled 2013-09-03: qty 1
  Filled 2013-09-03: qty 4
  Filled 2013-09-03 (×2): qty 1

## 2013-09-03 MED ORDER — VITAMIN B-1 100 MG PO TABS
ORAL_TABLET | ORAL | Status: AC
  Filled 2013-09-03: qty 1

## 2013-09-03 NOTE — [facility] Group Notes (Signed)
[facility] Group Notes:  Life skills  Date:  09/03/2013  Time:  2:45 PM  Type of Therapy:  Nurse Education  Participation Level:  Active  Participation Quality:  Appropriate  Affect:  Appropriate  Cognitive:  Alert  Insight:  Appropriate  Engagement in Group:  Engaged  Modes of Intervention:  Discussion  Summary of Progress/Problems:  Delman Kitten 09/03/2013, 2:45 PM

## 2013-09-03 NOTE — Progress Notes (Signed)
Adult Psychoeducational Group Note  Date:  09/03/2013 Time:  6:45 PM  Group Topic/Focus:  Therapeutic activity   Participation Level:  Active  Participation Quality:  Appropriate  Affect:  Appropriate  Cognitive:  Appropriate  Insight: Appropriate  Engagement in Group:  Engaged  Modes of Intervention:  Discussion  Additional Comments:  Pt attended group this afternoon. Pt was appropriate in group.   Yuvaan Olander A 09/03/2013, 6:45 PM

## 2013-09-03 NOTE — Progress Notes (Signed)
Pt attended AA group this evening.  

## 2013-09-03 NOTE — Progress Notes (Signed)
[facility] MD Progress Note  09/03/2013 1:32 PM [facility]  MRN:  [id number] Subjective:   Patient states "I was drinking quite a bit. It got to where I needed a drink before work. I have been having withdrawal symptoms. The sheets were soaked last night. I want to go to rehab. I am very worried about my job. I have not been taking care of myself. Look at these red places on my face. I know it is from the drinking."  Objective:  The patient is visible on the unit and attending the scheduled groups. She reports multiple withdrawal symptoms from chronic alcohol abuse. Rates her depression at five today. She is worried about the impact that drinking has had on her health. Patient has been compliant with scheduled medications and unit rules. Thea has been receiving prn medications such as zofran to help with the withdrawal symptoms.   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  AXIS IV: other psychosocial or environmental problems and Alcoholism, chronic  AXIS V: 30-40  ADL's:  Intact  Sleep: Fair  Appetite:  Fair  Suicidal Ideation:  Denies Homicidal Ideation:  Denies AEB (as evidenced by):  Psychiatric Specialty Exam: Physical Exam  Review of Systems  Constitutional: Positive for malaise/fatigue.  Eyes: Negative.   Respiratory: Negative.   Cardiovascular: Negative.   Gastrointestinal: Positive for heartburn and nausea.  Genitourinary: Negative.   Musculoskeletal: Negative.   Skin: Negative.   Neurological: Positive for tremors and headaches.  Endo/Heme/Allergies: Negative.   Psychiatric/Behavioral: Positive for depression and substance abuse. The  patient is nervous/anxious.     Blood pressure 100/69, pulse 96, temperature 98.6 F (37 C), temperature source Oral, resp. rate 20, height 5' 4"  (1.626 m), weight 56.246 kg (124 lb).Body mass index is 21.27 kg/(m^2).  General Appearance: Casual  Eye Contact::  Fair  Speech:  Clear and Coherent  Volume:  Normal  Mood:  Anxious and Depressed  Affect:  Flat  Thought Process:  Coherent and Goal Directed  Orientation:  Full (Time, Place, and Person)  Thought Content:  Rumination  Suicidal Thoughts:  No  Homicidal Thoughts:  No  Memory:  Immediate;   Fair Recent;   Fair Remote;   Fair  Judgement:  Fair  Insight:  Present  Psychomotor Activity:  Restlessness and Tremor  Concentration:  Fair  Recall:  Good  Fund of Knowledge:Fair  Language: Fair  Akathisia:  No  Handed:  Right  AIMS (if indicated):     Assets:  Communication Skills Desire for Improvement Resilience Social Support  Sleep:  Number of Hours: 6.25   Musculoskeletal: Strength & Muscle Tone: within normal limits Gait & Station: normal Patient leans: N/A  Current Medications: Current Facility-Administered Medications  Medication Dose Route Frequency Provider Last Rate Last Dose  . acetaminophen (TYLENOL) tablet 650 mg  650 mg Oral Q6H PRN Encarnacion Slates, NP      . alum & mag hydroxide-simeth (MAALOX/MYLANTA) 200-200-20 MG/5ML suspension 30 mL  30 mL Oral Q4H PRN Encarnacion Slates, NP      . chlordiazePOXIDE (LIBRIUM) capsule 25 mg  25 mg Oral Q6H PRN Encarnacion Slates, NP   25 mg at 09/03/13 4332  . chlordiazePOXIDE (LIBRIUM) capsule  25 mg  25 mg Oral TID Encarnacion Slates, NP   25 mg at 09/03/13 1200   Followed by  . [START ON 09/04/2013] chlordiazePOXIDE (LIBRIUM) capsule 25 mg  25 mg Oral BH-qamhs Encarnacion Slates, NP       Followed by  . [START ON 09/06/2013] chlordiazePOXIDE (LIBRIUM) capsule 25 mg  25 mg Oral Daily Encarnacion Slates, NP      . gabapentin (NEURONTIN) capsule 100 mg  100 mg Oral TID Encarnacion Slates, NP   100 mg at  09/03/13 1200  . hydrOXYzine (ATARAX/VISTARIL) tablet 25 mg  25 mg Oral Q6H PRN Encarnacion Slates, NP   25 mg at 09/02/13 2129  . loperamide (IMODIUM) capsule 2-4 mg  2-4 mg Oral PRN Encarnacion Slates, NP      . magnesium hydroxide (MILK OF MAGNESIA) suspension 30 mL  30 mL Oral Daily PRN Encarnacion Slates, NP      . methocarbamol (ROBAXIN) tablet 500 mg  500 mg Oral TID Encarnacion Slates, NP   500 mg at 09/03/13 1200  . multivitamin with minerals tablet 1 tablet  1 tablet Oral Daily Encarnacion Slates, NP   1 tablet at 09/03/13 0820  . nicotine (NICODERM CQ - dosed in mg/24 hours) patch 21 mg  21 mg Transdermal Daily Nicholaus Bloom, MD   21 mg at 09/03/13 0820  . ondansetron (ZOFRAN-ODT) disintegrating tablet 4 mg  4 mg Oral Q6H PRN Encarnacion Slates, NP   4 mg at 09/02/13 1041  . pantoprazole (PROTONIX) EC tablet 40 mg  40 mg Oral Daily PRN Encarnacion Slates, NP   40 mg at 09/02/13 1039  . tetrahydrozoline 0.05 % ophthalmic solution 1 drop  1 drop Both Eyes BID Encarnacion Slates, NP   1 drop at 09/03/13 0141  . thiamine (B-1) injection 100 mg  100 mg Intramuscular Once Encarnacion Slates, NP      . thiamine (VITAMIN B-1) 100 MG tablet           . thiamine (VITAMIN B-1) tablet 100 mg  100 mg Oral Daily Encarnacion Slates, NP   100 mg at 09/03/13 0824  . traZODone (DESYREL) tablet 75 mg  75 mg Oral QHS Donn Wilmot   75 mg at 09/02/13 2129    Lab Results:  Results for orders placed during the hospital encounter of 09/02/13 (from the past 48 hour(s))  ACETAMINOPHEN LEVEL     Status: None   Collection Time    09/02/13 12:15 AM      Result Value Ref Range   Acetaminophen (Tylenol), Serum <15.0  10 - 30 ug/mL   Comment:            THERAPEUTIC CONCENTRATIONS VARY     SIGNIFICANTLY. A RANGE OF 10-30     ug/mL MAY BE AN EFFECTIVE     CONCENTRATION FOR MANY PATIENTS.     HOWEVER, SOME ARE BEST TREATED     AT CONCENTRATIONS OUTSIDE THIS     RANGE.     ACETAMINOPHEN CONCENTRATIONS     >150 ug/mL AT 4 HOURS AFTER     INGESTION AND >50  ug/mL AT 12     HOURS AFTER INGESTION ARE     OFTEN ASSOCIATED WITH TOXIC     REACTIONS.  CBC     Status: Abnormal   Collection Time    09/02/13 12:15 AM      Result Value Ref Range  WBC 5.7  4.0 - 10.5 K/uL   RBC 4.72  3.87 - 5.11 MIL/uL   Hemoglobin 15.2 (*) 12.0 - 15.0 g/dL   HCT 42.3  36.0 - 46.0 %   MCV 89.6  78.0 - 100.0 fL   MCH 32.2  26.0 - 34.0 pg   MCHC 35.9  30.0 - 36.0 g/dL   RDW 12.5  11.5 - 15.5 %   Platelets 242  150 - 400 K/uL  COMPREHENSIVE METABOLIC PANEL     Status: Abnormal   Collection Time    09/02/13 12:15 AM      Result Value Ref Range   Sodium 130 (*) 137 - 147 mEq/L   Potassium 4.1  3.7 - 5.3 mEq/L   Chloride 88 (*) 96 - 112 mEq/L   CO2 22  19 - 32 mEq/L   Glucose, Bld 98  70 - 99 mg/dL   BUN 4 (*) 6 - 23 mg/dL   Creatinine, Ser 0.50  0.50 - 1.10 mg/dL   Calcium 9.7  8.4 - 10.5 mg/dL   Total Protein 7.7  6.0 - 8.3 g/dL   Albumin 4.5  3.5 - 5.2 g/dL   AST 40 (*) 0 - 37 U/L   ALT 23  0 - 35 U/L   Alkaline Phosphatase 87  39 - 117 U/L   Total Bilirubin 0.4  0.3 - 1.2 mg/dL   GFR calc non Af Amer >90  >90 mL/min   GFR calc Af Amer >90  >90 mL/min   Comment: (NOTE)     The eGFR has been calculated using the CKD EPI equation.     This calculation has not been validated in all clinical situations.     eGFR's persistently <90 mL/min signify possible Chronic Kidney     Disease.  ETHANOL     Status: Abnormal   Collection Time    09/02/13 12:15 AM      Result Value Ref Range   Alcohol, Ethyl (B) 263 (*) 0 - 11 mg/dL   Comment:            LOWEST DETECTABLE LIMIT FOR     SERUM ALCOHOL IS 11 mg/dL     FOR MEDICAL PURPOSES ONLY  SALICYLATE LEVEL     Status: Abnormal   Collection Time    09/02/13 12:15 AM      Result Value Ref Range   Salicylate Lvl <6.3 (*) 2.8 - 20.0 mg/dL  URINE RAPID DRUG SCREEN (HOSP PERFORMED)     Status: None   Collection Time    09/02/13  1:17 AM      Result Value Ref Range   Opiates NONE DETECTED  NONE DETECTED    Cocaine NONE DETECTED  NONE DETECTED   Benzodiazepines NONE DETECTED  NONE DETECTED   Amphetamines NONE DETECTED  NONE DETECTED   Tetrahydrocannabinol NONE DETECTED  NONE DETECTED   Barbiturates NONE DETECTED  NONE DETECTED   Comment:            DRUG SCREEN FOR MEDICAL PURPOSES     ONLY.  IF CONFIRMATION IS NEEDED     FOR ANY PURPOSE, NOTIFY LAB     WITHIN 5 DAYS.                LOWEST DETECTABLE LIMITS     FOR URINE DRUG SCREEN     Drug Class       Cutoff (ng/mL)     Amphetamine      1000  Barbiturate      200     Benzodiazepine   600     Tricyclics       459     Opiates          300     Cocaine          300     THC              50    Physical Findings: AIMS: Facial and Oral Movements Muscles of Facial Expression: None, normal Lips and Perioral Area: None, normal Jaw: None, normal Tongue: None, normal,Extremity Movements Upper (arms, wrists, hands, fingers): None, normal Lower (legs, knees, ankles, toes): None, normal, Trunk Movements Neck, shoulders, hips: None, normal, Overall Severity Severity of abnormal movements (highest score from questions above): None, normal Incapacitation due to abnormal movements: None, normal Patient's awareness of abnormal movements (rate only patient's report): No Awareness, Dental Status Current problems with teeth and/or dentures?: No Does patient usually wear dentures?: No  CIWA:  CIWA-Ar Total: 0 COWS:     Treatment Plan Summary: Daily contact with patient to assess and evaluate symptoms and progress in treatment Medication management  Plan: 1. Continue crisis management and stabilization.  2.Medication management:  -Continue Librium protocol for alcohol detox.  -Continue Neurontin 100 mg TID for anxiety/improved mood stability -Continue Trazodone 75 mg hs for insomnia.  3. Encouraged patient to attend groups and participate in group counseling sessions and activities.  4. Discharge plan in progress.  5. Continue current  treatment plan.  6. Address health issues: Vitals reviewed and stable. Change Protonix to 40 mg daily instead of prn acid reflux.   Medical Decision Making Problem Points:  Established problem, stable/improving (1), Review of last therapy session (1) and Review of psycho-social stressors (1) Data Points:  Review or order clinical lab tests (1) Review of medication regiment & side effects (2) Review of new medications or change in dosage (2)  I certify that inpatient services furnished can reasonably be expected to improve the patient's condition.   DAVIS, LAURA NP-C 09/03/2013, 1:32 PM  Patient seen, evaluated and I agree with notes by Nurse Practitioner. Corena Pilgrim, MD

## 2013-09-03 NOTE — BHH Group Notes (Signed)
[facility] Group Notes:  pscysocial group notes  Date:  09/03/2013  Time:  9:54 AM  Type of Therapy:  Psychoeducational Skills  Participation Level:  Active  Participation Quality:  Appropriate  Affect:  Appropriate  Cognitive:  Alert  Insight:  Appropriate  Engagement in Group:  Engaged  Modes of Intervention:  Discussion  Summary of Progress/Problems:pt states she has a strong family network and is very involved in church when she can go. Pt realizes she is not alone.   Marcello Moores [facility] 09/03/2013, 9:54 AM

## 2013-09-03 NOTE — Progress Notes (Signed)
Pt states her depression is a 5/10 and her hopelessness is a 5/10. Pt would like to wash her hair today and have her legs. Tech made aware that this is the pts goal. Her main complaint is night sweats. She would like to eat better, drink healthy drinks and continue treatment. Pt denies Si and HI and contracts for safety. Pt has been attending groups.

## 2013-09-03 NOTE — BHH Group Notes (Signed)
[facility] Group Notes:  (Clinical Social Work)  09/03/2013  10:00-11:00AM  Summary of Progress/Problems:   The main focus of today's process group was to   identify the patient's current support system and decide on other supports that can be put in place.  The picture on workbook was used to discuss why additional supports are needed.  An emphasis was placed on using counselor, doctor, therapy groups, 12-step groups, and problem-specific support groups to expand supports.   There was also an extensive discussion about what constitutes a healthy support versus an unhealthy support.  The patient was very participatory.  She talked about going to rehab in [facility] and making friends that are still friends, but who have relapsed.  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  This eventually led to her relapse.  She was then called out to see another staff member.  Type of Therapy:  Process Group with Motivational Interviewing  Participation Level:  Active  Participation Quality:  Attentive and Sharing  Affect:  Blunted  Cognitive:  Alert  Insight:  Engaged  Engagement in Therapy:  Engaged  Modes of Intervention:   Education, Support and Processing, Activity  [employer], LCSW 09/03/2013, 12:15pm

## 2013-09-03 NOTE — Progress Notes (Signed)
D. Pt anxious on approach, complaint of withdrawal symptoms.  Positive for evening AA group, interacting appropriately with peers on unit.  Denies SI/HI/hallucinations at this time.  A.  Support and encouragement offered, medications given as ordered for withdrawal  R.  Pt remains safe on unit, will continue to monitor.

## 2013-09-04 DIAGNOSIS — F329 Major depressive disorder, single episode, unspecified: Secondary | ICD-10-CM | POA: Diagnosis present

## 2013-09-04 DIAGNOSIS — F1020 Alcohol dependence, uncomplicated: Principal | ICD-10-CM

## 2013-09-04 DIAGNOSIS — F1994 Other psychoactive substance use, unspecified with psychoactive substance-induced mood disorder: Secondary | ICD-10-CM

## 2013-09-04 LAB — MAGNESIUM: MAGNESIUM: 1.9 mg/dL (ref 1.5–2.5)

## 2013-09-04 MED ORDER — TRAZODONE HCL 100 MG PO TABS
100.0000 mg | ORAL_TABLET | Freq: Every day | ORAL | Status: DC
Start: 2013-09-04 — End: 2013-09-05
  Administered 2013-09-04: 100 mg via ORAL
  Filled 2013-09-04: qty 1
  Filled 2013-09-04: qty 4
  Filled 2013-09-04 (×2): qty 1

## 2013-09-04 MED ORDER — ESCITALOPRAM OXALATE 10 MG PO TABS
10.0000 mg | ORAL_TABLET | Freq: Every day | ORAL | Status: DC
  Administered 2013-09-04 – 2013-09-05 (×2): 10 mg via ORAL
  Filled 2013-09-04 (×2): qty 1
  Filled 2013-09-04: qty 4
  Filled 2013-09-04 (×3): qty 1

## 2013-09-04 NOTE — Progress Notes (Signed)
Adult Psychoeducational Group Note  Date:  09/04/2013 Time:  11:19 AM  Group Topic/Focus:  Wellness Toolbox:   The focus of this group is to discuss various aspects of wellness, balancing those aspects and exploring ways to increase the ability to experience wellness.  Patients will create a wellness toolbox for use upon discharge.  Participation Level:  Minimal  Participation Quality:  Appropriate and Supportive  Affect:  Blunted and Flat  Cognitive:  Alert and Oriented  Insight: Good and Improving  Engagement in Group:  Improving and Lacking  Modes of Intervention:  Education  Additional Comments:  Pt described Wellness for her is being outside, gardening and being with nature. Pt shared about the danger of isolation for her happens when she has poor communication with her daughter. "I retreat into my trailer and don't see anyone". Pt admitted not knowing how to get around where she lives and having no friends.  Gunnar Bulla 09/04/2013, 11:19 AM

## 2013-09-04 NOTE — Progress Notes (Signed)
Adult Psychoeducational Group Note  Date:  09/04/2013 Time:  9:49 PM  Group Topic/Focus:  Wrap-Up Group:   The focus of this group is to help patients review their daily goal of treatment and discuss progress on daily workbooks.  Participation Level:  Active  Participation Quality:  Appropriate  Affect:  Appropriate  Cognitive:  Appropriate  Insight: Appropriate  Engagement in Group:  Engaged  Modes of Intervention:  Support  Additional Comments:  Pt stated that positive thing that happened today was that her daughter and grandkids came to see her today. And that she leaves tomorrow for [place]. She states that she has a positive attitude towards the change and and that she plans to stay focused. Also that she is really looking forward to the next step. Pt was given verbal encouragement from Probation officer and her peers  Isabel Caprice 09/04/2013, 9:49 PM

## 2013-09-04 NOTE — Progress Notes (Signed)
Patient ID: Brandi Gonzales, female   DOB: [birth year], 58 y.o.   MRN: [id number] She has been up and to groups interacting very little with peers and staff. Self inventory; depression 5, hopelessness 6. Withdrawals of craving ,chilling, denies SI thoughts.

## 2013-09-04 NOTE — Progress Notes (Signed)
D   Pt appears depressed and anxious   We discussed her rehab treatment in [place] and how she feels about it    Pt is calm and cooperative   A   Verbal support given   Medications administered and effectiveness monitored    Q 15 min checks R   Pt safe at present

## 2013-09-04 NOTE — BHH Group Notes (Signed)
[facility] LCSW Aftercare Discharge Planning Group Note   09/04/2013 10:16 AM  Participation Quality:  Appropriate   Mood/Affect:  Appropriate  Depression Rating:  8  Anxiety Rating:  8  Thoughts of Suicide:  No Will you contract for safety?   NA  Current AVH:  No  Plan for Discharge/Comments:  Pt reports that she had gotten into treatment center in [place] but does not have name, number or info. CSW assessing. Pt worried about losing job and has FMLA paperwork that she wants completed.   Transportation Means: daughter   Supports: daughter/family members.   [employer]

## 2013-09-04 NOTE — Progress Notes (Signed)
Patient ID: Brandi Gonzales, female   DOB: 18-Nov-1955, 58 y.o.   MRN: [id number] Activity:Listened to music and reflected on a quiet place where they find wellness. Drew a picture of this place and described it to the group.  Patient drew an ocean with a cottage, horses, and a friend. She loves riding on the beach with someone she cares about.

## 2013-09-04 NOTE — Progress Notes (Signed)
[facility] MD Progress Note  09/04/2013 4:03 PM Brandi Gonzales  MRN:  [id number] Subjective:  States that she is still having some underlying depression. She is trying to get her life back together. Her family is looking at a rehab place in [place]. Meanwhile she is struggling with depression. Reviewed the symptoms and she has experienced depression not always associated to the alcohol use. 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  HENT: Negative.   Eyes: Negative.   Respiratory: Negative.   Cardiovascular: Negative.   Gastrointestinal: Negative.   Genitourinary: Negative.   Musculoskeletal: Negative.   Skin: Negative.   Neurological: Positive for weakness.  Psychiatric/Behavioral: Positive for depression and substance abuse. The patient is nervous/anxious.     Blood pressure 99/64, pulse 112, temperature 97.5 F (36.4 C), temperature source Oral, resp. rate 20, height 5\' 4"  (1.626 m), weight 56.246 kg (124 lb).Body mass index is 21.27 kg/(m^2).  General Appearance: Fairly Groomed  Engineer, water::  Fair  Speech:  Clear and Coherent, Slow and not spontaneous  Volume:  Decreased  Mood:  Depressed  Affect:  Restricted  Thought Process:  Coherent and Goal Directed  Orientation:  Full (Time, Place, and Person)  Thought Content:  symptoms, worries, concerns  Suicidal  Thoughts:  No  Homicidal Thoughts:  No  Memory:  Immediate;   Fair Recent;   Fair Remote;   Fair  Judgement:  Fair  Insight:  Present  Psychomotor Activity:  Decreased  Concentration:  Fair  Recall:  [employer] of Knowledge:NA  Language: Fair  Akathisia:  No  Handed:    AIMS (if indicated):     Assets:  Desire for Improvement Social Support  Sleep:  Number of Hours: 5.5   Musculoskeletal: Strength & Muscle Tone: within normal limits Gait & Station: normal Patient leans: N/A  Current Medications: Current Facility-Administered Medications  Medication Dose Route Frequency Provider Last Rate Last Dose  . acetaminophen (TYLENOL) tablet 650 mg  650 mg Oral Q6H PRN Encarnacion Slates, NP      . alum & mag hydroxide-simeth (MAALOX/MYLANTA) 200-200-20 MG/5ML suspension 30 mL  30 mL Oral Q4H PRN Encarnacion Slates, NP      . chlordiazePOXIDE (LIBRIUM) capsule 25 mg  25 mg Oral Q6H PRN Encarnacion Slates, NP   25 mg at 09/03/13 2125  . chlordiazePOXIDE (LIBRIUM) capsule 25 mg  25 mg Oral BH-qamhs Encarnacion Slates, NP       Followed by  . [START ON 09/06/2013] chlordiazePOXIDE (LIBRIUM) capsule 25 mg  25 mg Oral Daily Encarnacion Slates, NP      . gabapentin (NEURONTIN) capsule 100 mg  100 mg Oral TID Encarnacion Slates, NP   100 mg at 09/04/13 1157  . hydrOXYzine (ATARAX/VISTARIL) tablet 25 mg  25 mg Oral Q6H PRN Encarnacion Slates, NP   25 mg at 09/03/13 2125  . loperamide (IMODIUM) capsule 2-4 mg  2-4 mg Oral  PRN Encarnacion Slates, NP      . magnesium hydroxide (MILK OF MAGNESIA) suspension 30 mL  30 mL Oral Daily PRN Encarnacion Slates, NP      . methocarbamol (ROBAXIN) tablet 500 mg  500 mg Oral TID Encarnacion Slates, NP   500 mg at 09/04/13 1157  . multivitamin with minerals tablet 1 tablet  1 tablet Oral Daily Encarnacion Slates, NP   1 tablet at 09/04/13 [id number]  . nicotine (NICODERM CQ - dosed in mg/24 hours) patch 21 mg  21 mg Transdermal Daily Nicholaus Bloom, MD   21 mg at 09/04/13 0841  . ondansetron (ZOFRAN-ODT) disintegrating  tablet 4 mg  4 mg Oral Q6H PRN Encarnacion Slates, NP   4 mg at 09/02/13 1041  . pantoprazole (PROTONIX) EC tablet 40 mg  40 mg Oral Daily Elmarie Shiley, NP   40 mg at 09/04/13 0842  . tetrahydrozoline 0.05 % ophthalmic solution 1 drop  1 drop Both Eyes BID Encarnacion Slates, NP   1 drop at 09/04/13 0843  . thiamine (B-1) injection 100 mg  100 mg Intramuscular Once Encarnacion Slates, NP      . thiamine (VITAMIN B-1) tablet 100 mg  100 mg Oral Daily Encarnacion Slates, NP   100 mg at 09/04/13 0842  . traZODone (DESYREL) tablet 100 mg  100 mg Oral QHS Nicholaus Bloom, MD        Lab Results: No results found for this or any previous visit (from the past 48 hour(s)).  Physical Findings: AIMS: Facial and Oral Movements Muscles of Facial Expression: None, normal Lips and Perioral Area: None, normal Jaw: None, normal Tongue: None, normal,Extremity Movements Upper (arms, wrists, hands, fingers): None, normal Lower (legs, knees, ankles, toes): None, normal, Trunk Movements Neck, shoulders, hips: None, normal, Overall Severity Severity of abnormal movements (highest score from questions above): None, normal Incapacitation due to abnormal movements: None, normal Patient's awareness of abnormal movements (rate only patient's report): No Awareness, Dental Status Current problems with teeth and/or dentures?: No Does patient usually wear dentures?: No  CIWA:  CIWA-Ar Total: 7 COWS:     Treatment Plan Summary: Daily contact with patient to assess and evaluate symptoms and progress in treatment Medication management  Plan: Supportive approach/coping skills/relapse prevention           Pursue Librium detox           Reassess and address the co morbidities           Trial with Lexapro 10 mg daily  Medical Decision Making Problem Points:  Established problem, worsening (2) and Review of psycho-social stressors (1) Data Points:  Review of medication regiment & side effects (2) Review of new medications or change in  dosage (2)  I certify that inpatient services furnished can reasonably be expected to improve the patient's condition.   Zully Frane A 09/04/2013, 4:03 PM
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
## 2013-09-04 NOTE — BHH Group Notes (Signed)
Deputy LCSW Group Therapy  09/04/2013 3:10 PM  Type of Therapy:  Group Therapy  Participation Level:  Active  Participation Quality:  Attentive  Affect:  Depressed and Flat  Cognitive:  Oriented  Insight:  Improving  Engagement in Therapy:  Engaged  Modes of Intervention:  Confrontation, Discussion, Education, Exploration, Problem-solving, Rapport Building, Socialization and Support  Summary of Progress/Problems: Today's Topic: Overcoming Obstacles. Pt identified obstacles faced currently and processed barriers involved in overcoming these obstacles. Pt identified steps necessary for overcoming these obstacles and explored motivation (internal and external) for facing these difficulties head on. Pt further identified one area of concern in their lives and chose a skill of focus pulled from their "toolbox." Brandi Gonzales shared that her biggest obstacle involves the possiblity of losing her job. Brandi Gonzales shows progress in the group setting and improving insight AEB her ability to process how going to treatment is necessary next step for her regardless of her job situation. Brandi Gonzales shared that she has FMLA paperwork and is doing all she can to take steps to protect her job while in treatment.    Smart, [name] LCSWA  09/04/2013, 3:10 PM

## 2013-09-04 NOTE — BHH Suicide Risk Assessment (Signed)
[facility] INPATIENT: Family/Significant Other Suicide Prevention Education  Suicide Prevention Education:  Education Completed; No one has been identified by the patient as the family member/significant other with whom the patient will be residing, and identified as the person(s) who will aid the patient in the event of a mental health crisis (suicidal ideations/suicide attempt).   Pt did not c/o SI at admission, nor have they endorsed SI during their stay here. SPE not required. SPI pamphlet provided to pt and she was encouraged to share information with support network, ask questions, and talk about any concerns relating to SPE.  [place], [place] 09/04/2013 11:04 AM

## 2013-09-04 NOTE — Clinical Social Work Note (Signed)
CSW left message on Christina's (pt's daughter) voicemail requesting info to treatment center that pt had gotten into. Pt does not know this information or the phone number.  [place], LCSWA 09/04/2013 3:24 PM

## 2013-09-05 DIAGNOSIS — F329 Major depressive disorder, single episode, unspecified: Secondary | ICD-10-CM

## 2013-09-05 MED ORDER — TRAZODONE HCL 100 MG PO TABS: 100.0000 mg | ORAL_TABLET | Freq: Every day | ORAL | Status: AC

## 2013-09-05 MED ORDER — ESCITALOPRAM OXALATE 10 MG PO TABS: 10.0000 mg | ORAL_TABLET | Freq: Every day | ORAL | Status: DC

## 2013-09-05 MED ORDER — PANTOPRAZOLE SODIUM 40 MG PO TBEC: 40.0000 mg | DELAYED_RELEASE_TABLET | Freq: Every day | ORAL | Status: DC | PRN

## 2013-09-05 MED ORDER — TETRAHYDROZOLINE HCL 0.05 % OP SOLN: 1.0000 [drp] | Freq: Two times a day (BID) | OPHTHALMIC | Status: AC

## 2013-09-05 MED ORDER — GABAPENTIN 100 MG PO CAPS: 100.0000 mg | ORAL_CAPSULE | Freq: Three times a day (TID) | ORAL | Status: DC

## 2013-09-05 NOTE — Progress Notes (Signed)
Adult Psychoeducational Group Note  Date: 09/05/2013  Time: 11:00am  Group Topic: Mental Health Jeopardy   Participation Level: Active   Participation Quality: Appropriate, Attentive and Supportive   Affect: Appropriate   Engagement in Group: Engaged   Activity: Patients participated in jeopardy like game, answering questions relating to mental health awareness in categories such as Symptoms, Medications, Causes, Coping Skills, and Substance Abuse.   Additional Comments: Pt came to group late and minimally participated.  Clint Bolder  09/05/2013, 1:48 PM

## 2013-09-05 NOTE — Tx Team (Signed)
Interdisciplinary Treatment Plan Update (Adult)  Date: 09/05/2013   Time Reviewed: 9:44 AM  Progress in Treatment:  Attending groups: Yes  Participating in groups: Yes   Taking medication as prescribed: Yes  Tolerating medication: Yes  Family/Significant othe contact made: Contact made with pt's daughter.  Patient understands diagnosis: Yes, AEB seeking treatment for ETOH detox, mood stabilization, and med management.  Discussing patient identified problems/goals with staff: Yes  Medical problems stabilized or resolved: Yes  Denies suicidal/homicidal ideation: Yes  Patient has not harmed self or Others: Yes  New problem(s) identified:  Discharge Plan or Barriers: Pt admitted to [facility] per her daughter. CSW attempting to make contact with Octavia Bruckner [phone] to verify this. Pt's daughter getting pt plane ticket to facility this evening. D/c scheduled for today.  Additional comments: Brandi Gonzales is 58 year old. Caucasian female. Admitted from the [facility]. She reports, "My daughter took me to the hospital because I have been drinking too much alcohol. I can do 6-8 cans of beer everyday. I have been doing this for a long time. I'm an alcoholic. I drink because of high stress levels, both personal and occupational. Everything about my life is stressful. Alcohol calms me when tensed. My longest sobriety was 6 months, that was 3 years ago. I relapsed because of stress". Reason for Continuation of Hospitalization: d/c today Estimated length of stay: d/c today  For review of initial/current patient goals, please see plan of care.  Attendees:  Patient:    Family:    Physician: Carlton Adam MD 09/05/2013 9:43 AM   Nursing: Butch Penny RN  09/05/2013 9:43 AM   Clinical Social Worker [place], [place]  09/05/2013 9:43 AM   Other: Jan RN 09/05/2013 9:43 AM   Other: Hardie Pulley. PA 09/05/2013 9:43 AM   Other: Chrys Racer RN 09/05/2013 9:43 AM   Other:    Scribe for Treatment Team:  Maxie Better  LCSWA 09/05/2013 9:44 AM

## 2013-09-05 NOTE — BHH Group Notes (Signed)
[facility] Group Notes:  (Nursing/MHT/Case Management/Adjunct)  Date:  09/05/2013  Time:  0900 am  Type of Therapy: Orientation Group  Participation Level:  Did Not Attend   Brandi Gonzales 09/05/2013, 12:27 PM

## 2013-09-05 NOTE — Discharge Summary (Signed)
Physician Discharge Summary Note  Patient:  Brandi Gonzales is an 58 y.o., female MRN:  [id number] DOB:  [birth year] Patient phone:  [phone] (home)  Patient address:   [address] [place] [address],  Total Time spent with patient: Greater than 30 minutes  Date of Admission:  09/02/2013 Date of Discharge: 09/05/13  Reason for Admission: Alcohol detox  Discharge Diagnoses: Principal Problem:   Alcohol dependence Active Problems:   Major depression   Psychiatric Specialty Exam: Physical Exam  Psychiatric: Her speech is normal and behavior is normal. Judgment and thought content normal. Her mood appears not anxious. Her affect is not angry, not blunt, not labile and not inappropriate. Cognition and memory are normal. She does not exhibit a depressed mood.    Review of Systems  Constitutional: Negative.   HENT: Negative.   Eyes: Negative.   Respiratory: Negative.   Cardiovascular: Negative.   Gastrointestinal: Negative.   Genitourinary: Negative.   Musculoskeletal: Negative.   Skin: Negative.   Neurological: Negative.   Endo/Heme/Allergies: Negative.   Psychiatric/Behavioral: Positive for depression (Stabilized with medication prior to discharge) and substance abuse (Alcoholism). Negative for suicidal ideas.    Blood pressure 135/85, pulse 94, temperature 97 F (36.1 C), temperature source Oral, resp. rate 16, height 5\' 4"  (1.626 m), weight 56.246 kg (124 lb).Body mass index is 21.27 kg/(m^2).   General Appearance: Fairly Groomed   Engineer, water:: Fair   Speech: Clear and Coherent   Volume: Decreased   Mood: Anxious and worried   Affect: anxious, worried   Thought Process: Coherent and Goal Directed   Orientation: Full (Time, Place, and Person)   Thought Content: symptoms, worries, concerns   Suicidal Thoughts: No   Homicidal Thoughts: No   Memory: Immediate; Fair  Recent; Fair  Remote; Fair   Judgement: Fair   Insight: Present   Psychomotor Activity:  Restlessness   Concentration: Fair   Recall: [employer] of Knowledge:NA   Language: Fair   Akathisia: No   Handed:   AIMS (if indicated):   Assets: Desire for Improvement  Social Support   Sleep: Number of Hours: 6.75    Past Psychiatric History: Diagnosis: Alcohol dependence, Major depression  Hospitalizations: [facility]  Outpatient Care: [facility]  Substance Abuse Care: [facility]  Self-Mutilation: NA  Suicidal Attempts: NA  Violent Behaviors: NA   Musculoskeletal: Strength & Muscle Tone: within normal limits Gait & Station: normal Patient leans: N/A  DSM5: Schizophrenia Disorders:  NA Obsessive-Compulsive Disorders:  NA Trauma-Stressor Disorders:  NA Substance/Addictive Disorders:  Alcohol Related Disorder - Severe (303.90) Depressive Disorders:  Major Depressive Disorder - Moderate (296.22)  Axis Diagnosis:   AXIS I:  Alcohol dependence, Major depression  AXIS II:  Deferred  AXIS III:  History reviewed. No pertinent past medical history.  AXIS IV:  other psychosocial or environmental problems and Alcoholism, chronic  AXIS V:  62  Level of Care:  OP  Hospital Course:  Brandi Gonzales is 58 year old. Caucasian female. Admitted from the [facility]. She reports, "My daughter took me to the hospital because I have been drinking too much alcohol. I can do 6-8 cans of beer everyday. I have been doing this for a long time. I'm an alcoholic. I drink because of high stress levels, both personal and occupational. Everything about my life is stressful. Alcohol calms me when tensed. My longest sobriety was 6 months, that was 3 years ago. I relapsed because of stress".  Brandi Gonzales was  admitted to the hospital with a blood alcohol level of 263 per toxicology reports. She was presenting with alcohol withdrawal symptoms, requiring detoxification treatment. To achieve this, Brandi Gonzales was medicated with Librium detoxification treatment protocols. She was also enrolled  and participated in the group counseling sessions, AA/NA meetings being offered and held on this unit. She learned coping skills. She also was ordered, received and discharged on; Citalopram 10 mg daily for depression, Gabapentin 100 mg three times daily for substance withdrawal syndrome and Trazodone 100 mg Q bedtime for sleep. She was resumed on all her pertinent home medications for her other pre-existing medical issues. Brandi Gonzales tolerated her treatment regimen without any significant adverse effects and or reactions.  Brandi Gonzales has completed detox treatment and her mood is stable. This is evidenced by her reports of improved mood and absence of substance withdrawal symptoms. She is currently being discharged to continue further substance abuse treatment at the [facility] in the state of [place]. She has been provided with all the necessary information necessary to contact this center and make the appointment without problems. Upon discharge, Brandi Gonzales adamantly denies any SIHI, AVH, delusional thoughts, paranoia and or withdrawal symptoms. Brandi Gonzales was provided with a 4 days worth supply samples of her [facility] discharge medications. She left [facility] with all personal belongings in no apparent distress. Transportation daughter.   Consults:  psychiatry  Significant Diagnostic Studies:  labs: CBC with diff, CMP, UDS, toxicology tests, U/A  Discharge Vitals:   Blood pressure 135/85, pulse 94, temperature 97 F (36.1 C), temperature source Oral, resp. rate 16, height 5\' 4"  (1.626 m), weight 56.246 kg (124 lb). Body mass index is 21.27 kg/(m^2). Lab Results:   Results for orders placed during the hospital encounter of 09/02/13 (from the past 72 hour(s))  MAGNESIUM     Status: None   Collection Time    09/04/13  7:33 PM      Result Value Ref Range   Magnesium 1.9  1.5 - 2.5 mg/dL   Comment: Performed at [facility]    Physical Findings: AIMS: Facial and Oral Movements Muscles of  Facial Expression: None, normal Lips and Perioral Area: None, normal Jaw: None, normal Tongue: None, normal,Extremity Movements Upper (arms, wrists, hands, fingers): None, normal Lower (legs, knees, ankles, toes): None, normal, Trunk Movements Neck, shoulders, hips: None, normal, Overall Severity Severity of abnormal movements (highest score from questions above): None, normal Incapacitation due to abnormal movements: None, normal Patient's awareness of abnormal movements (rate only patient's report): No Awareness, Dental Status Current problems with teeth and/or dentures?: No Does patient usually wear dentures?: No  CIWA:  CIWA-Ar Total: 4 COWS:     Psychiatric Specialty Exam: See Psychiatric Specialty Exam and Suicide Risk Assessment completed by Attending Physician prior to discharge.  Discharge destination:  Home  Is patient on multiple antipsychotic therapies at discharge:  No   Has Patient had three or more failed trials of antipsychotic monotherapy by history:  No  Recommended Plan for Multiple Antipsychotic Therapies: NA    Medication List    STOP taking these medications       sucralfate 1 G tablet  Commonly known as:  CARAFATE      TAKE these medications     Indication   escitalopram 10 MG tablet  Commonly known as:  LEXAPRO  Take 1 tablet (10 mg total) by mouth daily. For depression   Indication:  Depression     gabapentin 100 MG capsule  Commonly known as:  NEURONTIN  Take 1 capsule (100 mg total) by mouth 3 (three) times daily. For alcohol withdrawal syndrome   Indication:  Alcohol Withdrawal Syndrome     pantoprazole 40 MG tablet  Commonly known as:  PROTONIX  Take 1 tablet (40 mg total) by mouth daily as needed (acid reflux).   Indication:  Gastroesophageal Reflux Disease     tetrahydrozoline 0.05 % ophthalmic solution  Commonly known as:  VISINE  Place 1 drop into both eyes 2 (two) times daily. For eye irritation   Indication:  Irritation of the  Eye     traZODone 100 MG tablet  Commonly known as:  DESYREL  Take 1 tablet (100 mg total) by mouth at bedtime. For sleep   Indication:  Trouble Sleeping       Follow-up recommendations:  Activity:  As tolerated Diet: As recommended by your primary care doctor. Keep all scheduled follow-up appointments as recommended.  Comments: Take all your medications as prescribed by your mental healthcare provider. Report any adverse effects and or reactions from your medicines to your outpatient provider promptly. Patient is instructed and cautioned to not engage in alcohol and or illegal drug use while on prescription medicines. In the event of worsening symptoms, patient is instructed to call the crisis hotline, 911 and or go to the nearest ED for appropriate evaluation and treatment of symptoms. Follow-up with your primary care provider for your other medical issues, concerns and or health care needs.    Total Discharge Time:  Greater than 30 minutes.  Signed: Encarnacion Slates, PMHNP-BC 09/05/2013, 9:58 AM  I personally assessed the patient and formulated the plan Geralyn Flash A. Sabra Heck, M.D.

## 2013-09-05 NOTE — Clinical Social Work Note (Signed)
Pt and pt's daughter provided with FMLA paperwork completed per pt request. No SA information provided per her request. Mental health diagnosis included. Pt stated that her daughter would hand deliver to pt's employer.  [place], LCSWA 09/05/2013 1:42 PM

## 2013-09-05 NOTE — Progress Notes (Signed)
Patient ID: Brandi Gonzales, female   DOB: 10-18-1955, 58 y.o.   MRN: [id number] She has been up very little today says that she feels bad. Stomach unsettled, refused offer of prn medication. Self inventory: depression 6, hopelessness 7, withdrawals of chilling, denies SI thoughts and pain light headness.

## 2013-09-05 NOTE — Progress Notes (Signed)
Patient ID: Brandi Gonzales, female   DOB: Nov 01, 1955, 58 y.o.   MRN: [id number] She has been discharged and was picked up by her daughter. Pt. Voiced understanding of discharge instruction and of the follow up plan. She denies thoughts of SI and all her belongs were taken home with her.

## 2013-09-05 NOTE — Progress Notes (Signed)
[facility] Adult Case Management Discharge Plan :  Will you be returning to the same living situation after discharge: No.Pt accepted into tx facility At discharge, do you have transportation home?:Yes,  daughter driving pt to airport today Do you have the ability to pay for your medications:Yes,  [employer]  Release of information consent forms completed and submitted to Medical Records by CSW.  Patient to Follow up at: Follow-up Information   Follow up with [facility] On 09/05/2013. (Arrive this evening for admission to facility. )    Contact information:   [address] [place] [address] Phone: [phone] Fax: [phone]      Patient denies SI/HI:   Yes,  during admission, group, and self report.    Safety Planning and Suicide Prevention discussed:  Yes,  SPE not required for pt. Pt provided with SPI pamphlet and encouraged to share information with support network, ask questions, and talk about any concerns relating to SPE.  Smart, Heather LCSWA  09/05/2013, 12:18 PM

## 2013-09-05 NOTE — [facility] Suicide Risk Assessment (Signed)
Suicide Risk Assessment  Discharge Assessment     Demographic Factors:  Caucasian  Total Time spent with patient: 45 minutes  Psychiatric Specialty Exam:     Blood pressure 135/85, pulse 94, temperature 97 F (36.1 C), temperature source Oral, resp. rate 16, height 5\' 4"  (1.626 m), weight 56.246 kg (124 lb).Body mass index is 21.27 kg/(m^2).  General Appearance: Fairly Groomed  Engineer, water::  Fair  Speech:  Clear and Coherent  Volume:  Decreased  Mood:  Anxious and worried  Affect:  anxious, worried  Thought Process:  Coherent and Goal Directed  Orientation:  Full (Time, Place, and Person)  Thought Content:  symptoms, worries, concerns  Suicidal Thoughts:  No  Homicidal Thoughts:  No  Memory:  Immediate;   Fair Recent;   Fair Remote;   Fair  Judgement:  Fair  Insight:  Present  Psychomotor Activity:  Restlessness  Concentration:  Fair  Recall:  [employer] of Knowledge:NA  Language: Fair  Akathisia:  No  Handed:    AIMS (if indicated):     Assets:  Desire for Improvement Social Support  Sleep:  Number of Hours: 6.75    Musculoskeletal: Strength & Muscle Tone: within normal limits Gait & Station: normal Patient leans: N/A   Mental Status Per Nursing Assessment::   On Admission:     Current Mental Status by Physician: In full contact with reality. Some worries some anxiety. She will be going to a residential treatment center this afternoon in [place]. Her family has made the arragements   Loss Factors: NA  Historical Factors: NA  Risk Reduction Factors:   Sense of responsibility to family and Positive social support  Continued Clinical Symptoms:  Depression:   Comorbid alcohol abuse/dependence Alcohol/Substance Abuse/Dependencies  Cognitive Features That Contribute To Risk:  Closed-mindedness Polarized thinking Thought constriction (tunnel vision)    Suicide Risk:  Minimal: No identifiable suicidal ideation.  Patients presenting with no risk  factors but with morbid ruminations; may be classified as minimal risk based on the severity of the depressive symptoms  Discharge Diagnoses:   AXIS I:  Alcohol Dependence, S/P alcohol withdrawal, Major Depression recurrent AXIS II:  No diagnosis AXIS III:  History reviewed. No pertinent past medical history. AXIS IV:  other psychosocial or environmental problems AXIS V:  51-60 moderate symptoms  Plan Of Care/Follow-up recommendations:  Activity:  as tolerated Diet:  regular Follow up [facility] in [place] Is patient on multiple antipsychotic therapies at discharge:  No   Has Patient had three or more failed trials of antipsychotic monotherapy by history:  No  Recommended Plan for Multiple Antipsychotic Therapies: NA    LUGO,IRVING A 09/05/2013, 12:16 PM

## 2013-09-07 NOTE — Progress Notes (Signed)
Patient Discharge Instructions:  After Visit Summary (AVS):   Faxed to:  09/07/13 Discharge Summary Note:   Faxed to:  09/07/13 Psychiatric Admission Assessment Note:   Faxed to:  09/07/13 Suicide Risk Assessment - Discharge Assessment:   Faxed to:  09/07/13 Faxed/Sent to the Next Level Care provider:  09/07/13 Faxed to [facility], 09/07/2013, 3:35 PM

## 2015-11-17 ENCOUNTER — Encounter (HOSPITAL_COMMUNITY): Admission: EM | Disposition: A | Discharge status: Home Health | Payer: Self-pay | Source: Home / Self Care

## 2015-11-17 ENCOUNTER — Emergency Department (HOSPITAL_COMMUNITY): Payer: Managed Care, Other (non HMO)

## 2015-11-17 ENCOUNTER — Emergency Department (HOSPITAL_COMMUNITY): Payer: Managed Care, Other (non HMO) | Admitting: Certified Registered Nurse Anesthetist

## 2015-11-17 ENCOUNTER — Inpatient Hospital Stay (HOSPITAL_COMMUNITY)
Admission: EM | Admit: 2015-11-17 | Discharge: 2015-11-23 | DRG: 330 | Disposition: A | Discharge status: Home Health | Payer: Managed Care, Other (non HMO) | Attending: General Surgery | Admitting: General Surgery

## 2015-11-17 ENCOUNTER — Encounter (HOSPITAL_COMMUNITY): Payer: Self-pay | Admitting: Nurse Practitioner

## 2015-11-17 DIAGNOSIS — Z79899 Other long term (current) drug therapy: Secondary | ICD-10-CM | POA: Diagnosis not present

## 2015-11-17 DIAGNOSIS — K219 Gastro-esophageal reflux disease without esophagitis: Secondary | ICD-10-CM | POA: Diagnosis present

## 2015-11-17 DIAGNOSIS — K7290 Hepatic failure, unspecified without coma: Secondary | ICD-10-CM | POA: Diagnosis present

## 2015-11-17 DIAGNOSIS — F17200 Nicotine dependence, unspecified, uncomplicated: Secondary | ICD-10-CM | POA: Diagnosis present

## 2015-11-17 DIAGNOSIS — I8510 Secondary esophageal varices without bleeding: Secondary | ICD-10-CM | POA: Diagnosis present

## 2015-11-17 DIAGNOSIS — K567 Ileus, unspecified: Secondary | ICD-10-CM | POA: Diagnosis not present

## 2015-11-17 DIAGNOSIS — E876 Hypokalemia: Secondary | ICD-10-CM | POA: Diagnosis not present

## 2015-11-17 DIAGNOSIS — K255 Chronic or unspecified gastric ulcer with perforation: Secondary | ICD-10-CM | POA: Diagnosis present

## 2015-11-17 DIAGNOSIS — K265 Chronic or unspecified duodenal ulcer with perforation: Secondary | ICD-10-CM

## 2015-11-17 DIAGNOSIS — B9681 Helicobacter pylori [H. pylori] as the cause of diseases classified elsewhere: Secondary | ICD-10-CM | POA: Diagnosis present

## 2015-11-17 DIAGNOSIS — R198 Other specified symptoms and signs involving the digestive system and abdomen: Secondary | ICD-10-CM

## 2015-11-17 DIAGNOSIS — Z566 Other physical and mental strain related to work: Secondary | ICD-10-CM

## 2015-11-17 DIAGNOSIS — Z720 Tobacco use: Secondary | ICD-10-CM

## 2015-11-17 DIAGNOSIS — R188 Other ascites: Secondary | ICD-10-CM | POA: Diagnosis present

## 2015-11-17 DIAGNOSIS — R1084 Generalized abdominal pain: Secondary | ICD-10-CM | POA: Diagnosis present

## 2015-11-17 DIAGNOSIS — F329 Major depressive disorder, single episode, unspecified: Secondary | ICD-10-CM | POA: Diagnosis present

## 2015-11-17 DIAGNOSIS — F1010 Alcohol abuse, uncomplicated: Secondary | ICD-10-CM | POA: Diagnosis present

## 2015-11-17 DIAGNOSIS — F1020 Alcohol dependence, uncomplicated: Secondary | ICD-10-CM | POA: Diagnosis present

## 2015-11-17 DIAGNOSIS — K253 Acute gastric ulcer without hemorrhage or perforation: Secondary | ICD-10-CM

## 2015-11-17 DIAGNOSIS — K275 Chronic or unspecified peptic ulcer, site unspecified, with perforation: Secondary | ICD-10-CM

## 2015-11-17 DIAGNOSIS — K251 Acute gastric ulcer with perforation: Secondary | ICD-10-CM

## 2015-11-17 HISTORY — DX: Gastro-esophageal reflux disease without esophagitis: K21.9

## 2015-11-17 HISTORY — DX: Major depressive disorder, single episode, unspecified: F32.9

## 2015-11-17 HISTORY — DX: Alcohol dependence, uncomplicated: F10.20

## 2015-11-17 HISTORY — PX: LAPAROTOMY: SHX154

## 2015-11-17 HISTORY — DX: Depression, unspecified: F32.A

## 2015-11-17 LAB — SURGICAL PCR SCREEN
MRSA, PCR: NEGATIVE
STAPHYLOCOCCUS AUREUS: POSITIVE — AB

## 2015-11-17 LAB — CBC WITH DIFFERENTIAL/PLATELET
BASOS PCT: 0 %
Basophils Absolute: 0 10*3/uL (ref 0.0–0.1)
EOS PCT: 0 %
Eosinophils Absolute: 0 10*3/uL (ref 0.0–0.7)
HEMATOCRIT: 42 % (ref 36.0–46.0)
Hemoglobin: 14.8 g/dL (ref 12.0–15.0)
Lymphocytes Relative: 17 %
Lymphs Abs: 1.2 10*3/uL (ref 0.7–4.0)
MCH: 31.5 pg (ref 26.0–34.0)
MCHC: 35.2 g/dL (ref 30.0–36.0)
MCV: 89.4 fL (ref 78.0–100.0)
MONO ABS: 0.3 10*3/uL (ref 0.1–1.0)
MONOS PCT: 4 %
NEUTROS ABS: 5.3 10*3/uL (ref 1.7–7.7)
Neutrophils Relative %: 79 %
Platelets: 204 10*3/uL (ref 150–400)
RBC: 4.7 MIL/uL (ref 3.87–5.11)
RDW: 12.2 % (ref 11.5–15.5)
WBC: 6.7 10*3/uL (ref 4.0–10.5)

## 2015-11-17 LAB — LIPASE, BLOOD: Lipase: 48 U/L (ref 11–51)

## 2015-11-17 LAB — URINALYSIS, ROUTINE W REFLEX MICROSCOPIC
BILIRUBIN URINE: NEGATIVE
GLUCOSE, UA: NEGATIVE mg/dL
Hgb urine dipstick: NEGATIVE
KETONES UR: NEGATIVE mg/dL
Leukocytes, UA: NEGATIVE
Nitrite: NEGATIVE
PH: 7 (ref 5.0–8.0)
Protein, ur: NEGATIVE mg/dL
Specific Gravity, Urine: 1.016 (ref 1.005–1.030)

## 2015-11-17 LAB — COMPREHENSIVE METABOLIC PANEL
ALK PHOS: 51 U/L (ref 38–126)
ALT: 22 U/L (ref 14–54)
AST: 28 U/L (ref 15–41)
Albumin: 4.3 g/dL (ref 3.5–5.0)
Anion gap: 10 (ref 5–15)
BILIRUBIN TOTAL: 0.9 mg/dL (ref 0.3–1.2)
BUN: 12 mg/dL (ref 6–20)
CO2: 31 mmol/L (ref 22–32)
Calcium: 9.4 mg/dL (ref 8.9–10.3)
Chloride: 91 mmol/L — ABNORMAL LOW (ref 101–111)
Creatinine, Ser: 0.67 mg/dL (ref 0.44–1.00)
GFR calc Af Amer: >60 mL/min (ref >60)
GFR calc non Af Amer: >60 mL/min (ref >60)
GLUCOSE: 96 mg/dL (ref 65–99)
Potassium: 3.9 mmol/L (ref 3.5–5.1)
Sodium: 132 mmol/L — ABNORMAL LOW (ref 135–145)
TOTAL PROTEIN: 6.7 g/dL (ref 6.5–8.1)

## 2015-11-17 LAB — PROTIME-INR
INR: 1.03
Prothrombin Time: 13.6 seconds (ref 11.4–15.2)

## 2015-11-17 LAB — TYPE AND SCREEN
ABO/RH(D): A POS
Antibody Screen: NEGATIVE

## 2015-11-17 LAB — ABO/RH: ABO/RH(D): A POS

## 2015-11-17 SURGERY — LAPAROTOMY, EXPLORATORY
Anesthesia: General

## 2015-11-17 MED ORDER — DIPHENHYDRAMINE HCL 50 MG/ML IJ SOLN
25.0000 mg | Freq: Four times a day (QID) | INTRAMUSCULAR | Status: DC | PRN
  Administered 2015-11-18: 25 mg via INTRAVENOUS
  Filled 2015-11-17: qty 1

## 2015-11-17 MED ORDER — ONDANSETRON HCL 4 MG/2ML IJ SOLN
4.0000 mg | Freq: Once | INTRAMUSCULAR | Status: AC
  Administered 2015-11-17: 4 mg via INTRAVENOUS
  Filled 2015-11-17: qty 2

## 2015-11-17 MED ORDER — ENOXAPARIN SODIUM 40 MG/0.4ML ~~LOC~~ SOLN
40.0000 mg | SUBCUTANEOUS | Status: DC
  Administered 2015-11-18 – 2015-11-23 (×6): 40 mg via SUBCUTANEOUS
  Filled 2015-11-17 (×6): qty 0.4

## 2015-11-17 MED ORDER — MORPHINE SULFATE (PF) 4 MG/ML IV SOLN
4.0000 mg | INTRAVENOUS | Status: DC | PRN
  Administered 2015-11-17 (×2): 4 mg via INTRAVENOUS
  Filled 2015-11-17 (×2): qty 1

## 2015-11-17 MED ORDER — SUGAMMADEX SODIUM 200 MG/2ML IV SOLN
INTRAVENOUS | Status: DC | PRN
  Administered 2015-11-17: 300 mg via INTRAVENOUS

## 2015-11-17 MED ORDER — ERTAPENEM SODIUM 1 G IJ SOLR
1.0000 g | INTRAMUSCULAR | Status: AC
  Administered 2015-11-18 – 2015-11-22 (×5): 1 g via INTRAVENOUS
  Filled 2015-11-17 (×6): qty 1

## 2015-11-17 MED ORDER — MORPHINE SULFATE (PF) 2 MG/ML IV SOLN
2.0000 mg | INTRAVENOUS | Status: AC | PRN
  Administered 2015-11-17: 2 mg via INTRAVENOUS
  Filled 2015-11-17: qty 1

## 2015-11-17 MED ORDER — IOPAMIDOL (ISOVUE-300) INJECTION 61%
100.0000 mL | Freq: Once | INTRAVENOUS | Status: AC | PRN
  Administered 2015-11-17: 100 mL via INTRAVENOUS

## 2015-11-17 MED ORDER — SODIUM CHLORIDE 0.9 % IJ SOLN
INTRAMUSCULAR | Status: AC
  Filled 2015-11-17: qty 10

## 2015-11-17 MED ORDER — SUGAMMADEX SODIUM 500 MG/5ML IV SOLN
INTRAVENOUS | Status: AC
  Filled 2015-11-17: qty 5

## 2015-11-17 MED ORDER — ROCURONIUM BROMIDE 100 MG/10ML IV SOLN
INTRAVENOUS | Status: AC
  Filled 2015-11-17: qty 1

## 2015-11-17 MED ORDER — LACTATED RINGERS IV SOLN
INTRAVENOUS | Status: DC | PRN
  Administered 2015-11-17 (×2): via INTRAVENOUS

## 2015-11-17 MED ORDER — EPHEDRINE SULFATE 50 MG/ML IJ SOLN
INTRAMUSCULAR | Status: AC
  Filled 2015-11-17: qty 1

## 2015-11-17 MED ORDER — ROCURONIUM BROMIDE 100 MG/10ML IV SOLN
INTRAVENOUS | Status: DC | PRN
  Administered 2015-11-17: 40 mg via INTRAVENOUS

## 2015-11-17 MED ORDER — EPHEDRINE SULFATE 50 MG/ML IJ SOLN
INTRAMUSCULAR | Status: DC | PRN
  Administered 2015-11-17: 5 mg via INTRAVENOUS
  Administered 2015-11-17 (×2): 10 mg via INTRAVENOUS

## 2015-11-17 MED ORDER — PROPOFOL 10 MG/ML IV BOLUS
INTRAVENOUS | Status: DC | PRN
  Administered 2015-11-17: 50 mg via INTRAVENOUS
  Administered 2015-11-17: 100 mg via INTRAVENOUS

## 2015-11-17 MED ORDER — DEXAMETHASONE SODIUM PHOSPHATE 10 MG/ML IJ SOLN
INTRAMUSCULAR | Status: AC
  Filled 2015-11-17: qty 1

## 2015-11-17 MED ORDER — LORAZEPAM 2 MG/ML IJ SOLN
2.0000 mg | INTRAMUSCULAR | Status: DC | PRN
  Administered 2015-11-21: 2 mg via INTRAVENOUS
  Filled 2015-11-17: qty 1

## 2015-11-17 MED ORDER — PANTOPRAZOLE SODIUM 40 MG IV SOLR
40.0000 mg | Freq: Two times a day (BID) | INTRAVENOUS | Status: DC
  Administered 2015-11-17 – 2015-11-21 (×7): 40 mg via INTRAVENOUS
  Filled 2015-11-17 (×10): qty 40

## 2015-11-17 MED ORDER — LACTATED RINGERS IV SOLN
INTRAVENOUS | Status: DC
  Administered 2015-11-17 – 2015-11-19 (×4): via INTRAVENOUS
  Administered 2015-11-20 – 2015-11-21 (×2): 100 mL/h via INTRAVENOUS

## 2015-11-17 MED ORDER — LIDOCAINE HCL (CARDIAC) 20 MG/ML IV SOLN
INTRAVENOUS | Status: DC | PRN
  Administered 2015-11-17: 100 mg via INTRAVENOUS

## 2015-11-17 MED ORDER — ALBUMIN HUMAN 5 % IV SOLN
INTRAVENOUS | Status: AC
  Filled 2015-11-17: qty 250

## 2015-11-17 MED ORDER — LIDOCAINE HCL (CARDIAC) 20 MG/ML IV SOLN
INTRAVENOUS | Status: AC
  Filled 2015-11-17: qty 5

## 2015-11-17 MED ORDER — HYDROMORPHONE HCL 1 MG/ML IJ SOLN: 0.2500 mg | INTRAMUSCULAR | Status: DC | PRN

## 2015-11-17 MED ORDER — SODIUM CHLORIDE 0.9 % IV SOLN
INTRAVENOUS | Status: DC | PRN
  Administered 2015-11-17: 50 ug/min via INTRAVENOUS

## 2015-11-17 MED ORDER — 0.9 % SODIUM CHLORIDE (POUR BTL) OPTIME
TOPICAL | Status: DC | PRN
  Administered 2015-11-17: 4000 mL

## 2015-11-17 MED ORDER — LACTATED RINGERS IV SOLN: INTRAVENOUS | Status: DC

## 2015-11-17 MED ORDER — FENTANYL CITRATE (PF) 250 MCG/5ML IJ SOLN
INTRAMUSCULAR | Status: AC
  Filled 2015-11-17: qty 5

## 2015-11-17 MED ORDER — THIAMINE HCL 100 MG/ML IJ SOLN
100.0000 mg | Freq: Every day | INTRAMUSCULAR | Status: DC
  Administered 2015-11-17 – 2015-11-23 (×7): 100 mg via INTRAVENOUS
  Filled 2015-11-17 (×7): qty 2

## 2015-11-17 MED ORDER — LACTATED RINGERS IV SOLN
INTRAVENOUS | Status: DC | PRN
  Administered 2015-11-17 (×2): via INTRAVENOUS

## 2015-11-17 MED ORDER — ONDANSETRON HCL 4 MG/2ML IJ SOLN
INTRAMUSCULAR | Status: DC | PRN
  Administered 2015-11-17: 4 mg via INTRAVENOUS

## 2015-11-17 MED ORDER — ONDANSETRON 4 MG PO TBDP: 4.0000 mg | ORAL_TABLET | Freq: Four times a day (QID) | ORAL | Status: DC | PRN

## 2015-11-17 MED ORDER — SODIUM CHLORIDE 0.9 % IV BOLUS (SEPSIS)
500.0000 mL | Freq: Once | INTRAVENOUS | Status: AC
  Administered 2015-11-17: 1000 mL via INTRAVENOUS

## 2015-11-17 MED ORDER — DEXAMETHASONE SODIUM PHOSPHATE 10 MG/ML IJ SOLN
INTRAMUSCULAR | Status: DC | PRN
  Administered 2015-11-17: 10 mg via INTRAVENOUS

## 2015-11-17 MED ORDER — FOLIC ACID 5 MG/ML IJ SOLN
1.0000 mg | Freq: Every day | INTRAMUSCULAR | Status: DC
  Administered 2015-11-17 – 2015-11-23 (×7): 1 mg via INTRAVENOUS
  Filled 2015-11-17 (×12): qty 0.2

## 2015-11-17 MED ORDER — HYDROMORPHONE HCL 1 MG/ML IJ SOLN
1.0000 mg | INTRAMUSCULAR | Status: AC | PRN
  Administered 2015-11-17 (×2): 1 mg via INTRAVENOUS
  Filled 2015-11-17 (×2): qty 1

## 2015-11-17 MED ORDER — PROMETHAZINE HCL 25 MG/ML IJ SOLN: 6.2500 mg | INTRAMUSCULAR | Status: DC | PRN

## 2015-11-17 MED ORDER — DIPHENHYDRAMINE HCL 50 MG/ML IJ SOLN: 12.5000 mg | Freq: Once | INTRAMUSCULAR | Status: DC

## 2015-11-17 MED ORDER — PROPOFOL 10 MG/ML IV BOLUS
INTRAVENOUS | Status: AC
  Filled 2015-11-17: qty 20

## 2015-11-17 MED ORDER — MIDAZOLAM HCL 2 MG/2ML IJ SOLN
INTRAMUSCULAR | Status: AC
  Filled 2015-11-17: qty 2

## 2015-11-17 MED ORDER — ONDANSETRON HCL 4 MG/2ML IJ SOLN
INTRAMUSCULAR | Status: AC
  Filled 2015-11-17: qty 2

## 2015-11-17 MED ORDER — FAMOTIDINE IN NACL 20-0.9 MG/50ML-% IV SOLN: 20.0000 mg | Freq: Two times a day (BID) | INTRAVENOUS | Status: DC

## 2015-11-17 MED ORDER — SUCCINYLCHOLINE CHLORIDE 20 MG/ML IJ SOLN
INTRAMUSCULAR | Status: DC | PRN
  Administered 2015-11-17: 100 mg via INTRAVENOUS

## 2015-11-17 MED ORDER — MIDAZOLAM HCL 5 MG/5ML IJ SOLN
INTRAMUSCULAR | Status: DC | PRN
  Administered 2015-11-17: 2 mg via INTRAVENOUS

## 2015-11-17 MED ORDER — ONDANSETRON HCL 4 MG/2ML IJ SOLN: 4.0000 mg | Freq: Once | INTRAMUSCULAR | Status: DC

## 2015-11-17 MED ORDER — SODIUM CHLORIDE 0.9 % IV SOLN
1000.0000 mg | INTRAVENOUS | Status: DC
  Filled 2015-11-17: qty 1

## 2015-11-17 MED ORDER — MORPHINE SULFATE (PF) 2 MG/ML IV SOLN
2.0000 mg | INTRAVENOUS | Status: AC | PRN
Start: 2015-11-17 — End: 2015-11-17
  Administered 2015-11-17: 3 mg via INTRAVENOUS
  Filled 2015-11-17: qty 2

## 2015-11-17 MED ORDER — PHENYLEPHRINE HCL 10 MG/ML IJ SOLN
INTRAMUSCULAR | Status: DC | PRN
  Administered 2015-11-17: 80 ug via INTRAVENOUS

## 2015-11-17 MED ORDER — DIPHENHYDRAMINE HCL 25 MG PO CAPS: 25.0000 mg | ORAL_CAPSULE | Freq: Four times a day (QID) | ORAL | Status: DC | PRN

## 2015-11-17 MED ORDER — SODIUM CHLORIDE 0.9 % IV SOLN
INTRAVENOUS | Status: DC | PRN
  Administered 2015-11-17: 1 g via INTRAVENOUS

## 2015-11-17 MED ORDER — FENTANYL CITRATE (PF) 100 MCG/2ML IJ SOLN
INTRAMUSCULAR | Status: DC | PRN
  Administered 2015-11-17: 50 ug via INTRAVENOUS
  Administered 2015-11-17 (×4): 25 ug via INTRAVENOUS
  Administered 2015-11-17 (×2): 50 ug via INTRAVENOUS

## 2015-11-17 MED ORDER — ONDANSETRON HCL 4 MG/2ML IJ SOLN
4.0000 mg | Freq: Four times a day (QID) | INTRAMUSCULAR | Status: DC | PRN
  Administered 2015-11-17 – 2015-11-19 (×3): 4 mg via INTRAVENOUS
  Filled 2015-11-17 (×4): qty 2

## 2015-11-17 MED ORDER — FAMOTIDINE IN NACL 20-0.9 MG/50ML-% IV SOLN
20.0000 mg | Freq: Two times a day (BID) | INTRAVENOUS | Status: DC
  Administered 2015-11-17 – 2015-11-21 (×10): 20 mg via INTRAVENOUS
  Filled 2015-11-17 (×11): qty 50

## 2015-11-17 MED ORDER — MORPHINE SULFATE (PF) 4 MG/ML IV SOLN: 4.0000 mg | INTRAVENOUS | Status: DC | PRN

## 2015-11-17 MED ORDER — SODIUM CHLORIDE 0.9 % IV SOLN
Freq: Once | INTRAVENOUS | Status: AC
  Administered 2015-11-17: 12:00:00 via INTRAVENOUS

## 2015-11-17 SURGICAL SUPPLY — 51 items
BLADE EXTENDED COATED 6.5IN (ELECTRODE) IMPLANT
BLADE HEX COATED 2.75 (ELECTRODE) ×3 IMPLANT
BLADE SURG SZ10 CARB STEEL (BLADE) ×3 IMPLANT
CHLORAPREP W/TINT 26ML (MISCELLANEOUS) ×3 IMPLANT
CLIP TI LARGE 6 (CLIP) IMPLANT
COVER MAYO STAND STRL (DRAPES) ×3 IMPLANT
COVER SURGICAL LIGHT HANDLE (MISCELLANEOUS) ×3 IMPLANT
DRAPE LAPAROSCOPIC ABDOMINAL (DRAPES) ×3 IMPLANT
DRAPE SHEET LG 3/4 BI-LAMINATE (DRAPES) IMPLANT
DRAPE UTILITY XL STRL (DRAPES) ×3 IMPLANT
DRAPE WARM FLUID 44X44 (DRAPE) ×3 IMPLANT
ELECT REM PT RETURN 9FT ADLT (ELECTROSURGICAL) ×3
ELECTRODE REM PT RTRN 9FT ADLT (ELECTROSURGICAL) ×1 IMPLANT
GAUZE SPONGE 4X4 12PLY STRL (GAUZE/BANDAGES/DRESSINGS) ×3 IMPLANT
GLOVE BIO SURGEON STRL SZ 6.5 (GLOVE) ×2 IMPLANT
GLOVE BIO SURGEONS STRL SZ 6.5 (GLOVE) ×1
GLOVE BIOGEL PI IND STRL 7.0 (GLOVE) ×1 IMPLANT
GLOVE BIOGEL PI INDICATOR 7.0 (GLOVE) ×2
GOWN L4 XXLG W/PAP TWL (GOWN DISPOSABLE) ×3 IMPLANT
HANDLE SUCTION POOLE (INSTRUMENTS) ×1 IMPLANT
KIT BASIN OR (CUSTOM PROCEDURE TRAY) ×3 IMPLANT
LEGGING LITHOTOMY PAIR STRL (DRAPES) IMPLANT
LIGASURE IMPACT 36 18CM CVD LR (INSTRUMENTS) IMPLANT
NS IRRIG 1000ML POUR BTL (IV SOLUTION) ×6 IMPLANT
PACK GENERAL/GYN (CUSTOM PROCEDURE TRAY) ×3 IMPLANT
PAD ABD 8X10 STRL (GAUZE/BANDAGES/DRESSINGS) ×3 IMPLANT
SHEARS HARMONIC ACE PLUS 36CM (ENDOMECHANICALS) IMPLANT
SPONGE LAP 18X18 X RAY DECT (DISPOSABLE) ×9 IMPLANT
STAPLER VISISTAT 35W (STAPLE) ×3 IMPLANT
SUCTION POOLE HANDLE (INSTRUMENTS) ×3
SUT NOV 1 T60/GS (SUTURE) IMPLANT
SUT NOVA NAB GS-21 0 18 T12 DT (SUTURE) ×3 IMPLANT
SUT NOVA NAB GS-22 2 0 T19 (SUTURE) ×3 IMPLANT
SUT PDS AB 1 CTX 36 (SUTURE) ×6 IMPLANT
SUT PDS AB 1 TP1 96 (SUTURE) IMPLANT
SUT SILK 2 0 (SUTURE) ×8
SUT SILK 2 0 SH CR/8 (SUTURE) ×3 IMPLANT
SUT SILK 2 0SH CR/8 30 (SUTURE) IMPLANT
SUT SILK 2-0 18XBRD TIE 12 (SUTURE) ×2 IMPLANT
SUT SILK 2-0 30XBRD TIE 12 (SUTURE) ×2 IMPLANT
SUT SILK 3 0 (SUTURE)
SUT SILK 3 0 SH CR/8 (SUTURE) IMPLANT
SUT SILK 3-0 18XBRD TIE 12 (SUTURE) IMPLANT
SUT VIC AB 2-0 SH 18 (SUTURE) ×3 IMPLANT
SUT VIC AB 3-0 54XBRD REEL (SUTURE) IMPLANT
SUT VIC AB 3-0 BRD 54 (SUTURE)
TAPE CLOTH SURG 6X10 WHT LF (GAUZE/BANDAGES/DRESSINGS) ×3 IMPLANT
TOWEL OR 17X26 10 PK STRL BLUE (TOWEL DISPOSABLE) ×3 IMPLANT
TRAY FOLEY W/METER SILVER 14FR (SET/KITS/TRAYS/PACK) ×3 IMPLANT
TRAY FOLEY W/METER SILVER 16FR (SET/KITS/TRAYS/PACK) ×3 IMPLANT
YANKAUER SUCT BULB TIP NO VENT (SUCTIONS) ×3 IMPLANT

## 2015-11-17 NOTE — Transfer of Care (Signed)
Immediate Anesthesia Transfer of Care Note  Patient: Brandi Gonzales  Procedure(s) Performed: Procedure(s): EXPLORATORY LAPAROTOMY, GRAHAM PATCH ANTRAL ULCER (N/A)  Patient Location: PACU  Anesthesia Type:General  Level of Consciousness: awake, alert  and oriented  Immediate Anesthesia Transfer of Care Note  Patient: Brandi Gonzales  Procedure(s) Performed: Procedure(s): EXPLORATORY LAPAROTOMY, GRAHAM PATCH ANTRAL ULCER (N/A)  Patient Location: PACU  Anesthesia Type:General  Level of Consciousness:  sedated, patient cooperative and responds to stimulation  Airway & Oxygen Therapy:Patient Spontanous Breathing and Patient connected to face mask oxgen  Post-op Assessment:  Report given to PACU RN and Post -op Vital signs reviewed and stable  Post vital signs:  Reviewed and stable  Last Vitals:  Vitals:   11/17/15 1815 11/17/15 1823  BP:  (!) 146/80  Pulse:  (!) 109  Resp:    Temp: [id number] C     Complications: No apparent anesthesia complications Airway & Oxygen Therapy: Patient Spontanous Breathing and Patient connected to face mask oxygen  Post-op Assessment: Report given to RN and Post -op Vital signs reviewed and stable  Post vital signs: Reviewed and stable  Last Vitals:  Vitals:   11/17/15 1815 11/17/15 1823  BP:  (!) 146/80  Pulse:  (!) 109  Resp:    Temp: 36.4 C     Last Pain:  Vitals:   11/17/15 1531  PainSc: 10-Worst pain ever         Complications: No apparent anesthesia complications

## 2015-11-17 NOTE — Op Note (Signed)
11/17/2015  6:26 PM  PATIENT:  Judie Grieve  60 y.o. female  Patient Care Team: No Pcp Per Patient as PCP - General (General Practice)  PRE-OPERATIVE DIAGNOSIS:  gastric perforation  POST-OPERATIVE DIAGNOSIS:  gastric perforation  PROCEDURE:  Procedure(s): EXPLORATORY LAPAROTOMY, GRAHAM PATCH ANTRAL ULCER  SURGEON:  Surgeon(s): Leighton Ruff, MD Georganna Skeans, MD  ASSISTANT: Dr Grandville Silos   ANESTHESIA:   general  EBL:  Total I/O In: 2900 [I.V.:2900] Out: 50 [Blood:50]  DRAINS: none   SPECIMEN:  No Specimen  DISPOSITION OF SPECIMEN:  N/A  COUNTS:  YES  PLAN OF CARE: Admit to inpatient   PATIENT DISPOSITION:  PACU - hemodynamically stable.  INDICATION: 60 year old alcoholic female who presents to the ED with acute onset of abdominal pain. CT scan shows free air surrounding her upper GI tract.   OR FINDINGS: Perforated gastric antral ulcer. Pinhole perforation, covered with Phillip Heal patch.  DESCRIPTION: the patient was identified in the preoperative holding area and taken to the OR where they were laid  supine on the operating room table.  General anesthesia was induced without difficulty. SCDs were also noted to be in place prior to the initiation of anesthesia.  The patient was then prepped and draped in the usual sterile fashion.   A surgical timeout was performed indicating the correct patient, procedure, positioning and need for preoperative antibiotics.  I began by making a upper midline incision using a 10 blade scalpel. Dissection was carried down through the subcutaneous tissues using electrocautery. The fascia was incised at midline. The peritoneum was entered bluntly and upon entering the abdomen. Purulent ascites was encountered. This is mostly confined to the upper abdomen.  I evaluated the entire stomach and duodenum. A small pinhole was noted in the gastric antrum on the anterior side. It had been partially covered with omentum. I helped the CRNA pass an NG  tube into the stomach. This was secured from above. I then used 2-0 silk sutures to perform a Phillip Heal patch over the ulcer. I evaluated the entire abdomen. There was no other signs of inflammation. There was no mass noted at the GE junction as suggested on CT scan reading. The liver was not cirrhotic. We then irrigated with 3 L of warm normal saline. Given that the perforation was small and there was no bilious drainage, I did not leave a drain. I then closed the fascia using 2-0 PDS sutures. I loosely close the subcutaneous tissues over this using 2-0 Vicryl sutures. The skin was then left open and packed. A sterile dressing was applied. The patient was then awakened from anesthesia and sent to the postanesthesia care unit in stable condition. All counts were correct per operating room staff.

## 2015-11-17 NOTE — ED Notes (Signed)
Bed: WA03 Expected date:  Expected time:  Means of arrival:  Comments: EMS-abdominal pain 

## 2015-11-17 NOTE — ED Triage Notes (Signed)
Patient presents to [facility] for complaints of abdominal pain that started suddenly this morning. She localizes pain to LUQ and LLQ. EMS 4 mg zofran which helped her nausea but did not help her pain. She describes pain as muscle tight/clenched pain. She initially took an Alka-Seltzer this morning for possible acid reflux but her pain did not improve. Pain is worsened by movement.

## 2015-11-17 NOTE — ED Provider Notes (Signed)
[facility] [place] DEPT Provider Note   CSN: [id number] Arrival date & time: 11/17/15  1051     History   Chief Complaint Chief Complaint  Patient presents with  . Abdominal Pain    LLQ & LUQ    HPI Brandi Gonzales is a 60 y.o. female.  She complains of sudden severe abdominal pain starting this morning. Tubular does not have problems with her stomach. However, she does take a "reflux medicine" daily. She drinks 3-6 beers per day. Denies withdrawal symptoms on days that she does not drink. Denies history of pancreatitis or hepatitis related to her alcohol. Smokes daily. His never been diagnosed with ulcer disease.  Past medical history otherwise includes depression, for which she takes Lexapro.  HPI  Past Medical History:  Diagnosis Date  . Acid reflux   . Alcoholism, chronic ([place])   . Depression     Patient Active Problem List   Diagnosis Date Noted  . Major depression ([place]) 09/04/2013  . Alcohol dependence ([place]) 09/02/2013    Past Surgical History:  Procedure Laterality Date  . TUBAL LIGATION      OB History    No data available       Home Medications    Prior to Admission medications   Medication Sig Start Date End Date Taking? Authorizing Provider  alendronate (FOSAMAX) 70 MG tablet Take 70 mg by mouth once a week. mondays 09/03/15  Yes Historical Provider, MD  escitalopram (LEXAPRO) 10 MG tablet Take 1 tablet (10 mg total) by mouth daily. For depression 09/05/13  Yes Encarnacion Slates, NP  pantoprazole (PROTONIX) 40 MG tablet Take 1 tablet (40 mg total) by mouth daily as needed (acid reflux). 09/05/13  Yes Encarnacion Slates, NP  tetrahydrozoline (VISINE) 0.05 % ophthalmic solution Place 1 drop into both eyes 2 (two) times daily. For eye irritation 09/05/13  Yes Encarnacion Slates, NP  gabapentin (NEURONTIN) 100 MG capsule Take 1 capsule (100 mg total) by mouth 3 (three) times daily. For alcohol withdrawal syndrome Patient not taking: Reported on 11/17/2015 09/05/13   Encarnacion Slates, NP  traZODone (DESYREL) 100 MG tablet Take 1 tablet (100 mg total) by mouth at bedtime. For sleep Patient not taking: Reported on 11/17/2015 09/05/13   Encarnacion Slates, NP    Family History History reviewed. No pertinent family history.  Social History Social History  Substance Use Topics  . Smoking status: Current Every Day Smoker  . Smokeless tobacco: Never Used  . Alcohol use 24.0 oz/week    40 Cans of beer per week     Allergies   Compazine [prochlorperazine]   Review of Systems Review of Systems  Constitutional: Negative for appetite change, chills, diaphoresis, fatigue and fever.  HENT: Negative for mouth sores, sore throat and trouble swallowing.   Eyes: Negative for visual disturbance.  Respiratory: Negative for cough, chest tightness, shortness of breath and wheezing.   Cardiovascular: Negative for chest pain.  Gastrointestinal: Positive for abdominal pain and nausea. Negative for abdominal distention, diarrhea and vomiting.  Endocrine: Negative for polydipsia, polyphagia and polyuria.  Genitourinary: Negative for dysuria, frequency and hematuria.  Musculoskeletal: Negative for gait problem.  Skin: Negative for color change, pallor and rash.  Neurological: Negative for dizziness, syncope, light-headedness and headaches.  Hematological: Does not bruise/bleed easily.  Psychiatric/Behavioral: Negative for behavioral problems and confusion.     Physical Exam Updated Vital Signs BP 119/72   Pulse 103   Temp 98.3 F (36.8 C)   Resp  21   Ht 5\' 4"  (1.626 m)   Wt 125 lb (56.7 kg)   SpO2 90%   BMI 21.46 kg/m   Physical Exam  Constitutional: She is oriented to person, place, and time. She appears well-developed and well-nourished. No distress.  HENT:  Head: Normocephalic.  Eyes: Conjunctivae are normal. Pupils are equal, round, and reactive to light. No scleral icterus.  Neck: Normal range of motion. Neck supple. No thyromegaly present.  Cardiovascular:  Normal rate and regular rhythm.  Exam reveals no gallop and no friction rub.   No murmur heard. Pulmonary/Chest: Effort normal and breath sounds normal. No respiratory distress. She has no wheezes. She has no rales.  Abdominal: Soft. She exhibits no distension. There is tenderness. There is no rebound.  Diffuse tenderness across the epigastrium. No frank rigidity. Pain with movement. Pain with cough. Describes pain into her back.  Hypoactive bowel sounds  Musculoskeletal: Normal range of motion.  Neurological: She is alert and oriented to person, place, and time.  Skin: Skin is warm and dry. No rash noted.  Psychiatric: She has a normal mood and affect. Her behavior is normal.     ED Treatments / Results  Labs (all labs ordered are listed, but only abnormal results are displayed) Labs Reviewed  COMPREHENSIVE METABOLIC PANEL - Abnormal; Notable for the following:       Result Value   Sodium 132 (*)    Chloride 91 (*)    All other components within normal limits  URINALYSIS, ROUTINE W REFLEX MICROSCOPIC (NOT AT [facility]) - Abnormal; Notable for the following:    APPearance CLOUDY (*)    All other components within normal limits  CBC WITH DIFFERENTIAL/PLATELET  LIPASE, BLOOD  PROTIME-INR    EKG  EKG Interpretation None       Radiology No results found.  Procedures Procedures (including critical care time)  Medications Ordered in ED Medications  morphine 4 MG/ML injection 4 mg (4 mg Intravenous Given 11/17/15 1305)  famotidine (PEPCID) IVPB 20 mg premix (0 mg Intravenous Stopped 11/17/15 1227)  HYDROmorphone (DILAUDID) injection 1 mg (1 mg Intravenous Given 11/17/15 1405)  ondansetron (ZOFRAN) injection 4 mg (4 mg Intravenous Given 11/17/15 1157)  sodium chloride 0.9 % bolus 500 mL (0 mLs Intravenous Stopped 11/17/15 1216)  0.9 %  sodium chloride infusion ( Intravenous Stopped 11/17/15 1356)  iopamidol (ISOVUE-300) 61 % injection 100 mL (100 mLs Intravenous Contrast Given  11/17/15 1426)     Initial Impression / Assessment and Plan / ED Course  I have reviewed the triage vital signs and the nursing notes.  Pertinent labs & imaging results that were available during my care of the patient were reviewed by me and considered in my medical decision making (see chart for details).  Clinical Course    Normal hepatobiliary enzymes and lipase. No leukocytosis. Hemoglobin 14.8. CT scan shows free air consistent with perforated viscus. We'll consult surgery. Patient given fluids and antiemetics by EMS. Given additional pain medication here including morphine 4 mg IV 2 with minimal relief. Given Dilaudid 1 mg. Remains hemodynamically stable.  Surgery consult pending.  Final Clinical Impressions(s) / ED Diagnoses   Final diagnoses:  Perforated viscus  Generalized abdominal pain    New Prescriptions New Prescriptions   No medications on file     Tanna Furry, MD 11/17/15 1456

## 2015-11-17 NOTE — Anesthesia Preprocedure Evaluation (Addendum)
Anesthesia Evaluation  Patient identified by MRN, date of birth, ID band Patient awake    History of Anesthesia Complications Negative for: history of anesthetic complications  Airway Mallampati: II  TM Distance: >3 FB Neck ROM: Full    Dental  (+) Teeth Intact   Pulmonary neg pulmonary ROS, Current Smoker,    breath sounds clear to auscultation       Cardiovascular negative cardio ROS   Rhythm:Irregular Rate:Normal     Neuro/Psych    GI/Hepatic GERD  ,(+)     substance abuse  alcohol use, ETOH daily abd pain severe   Endo/Other  negative endocrine ROS  Renal/GU negative Renal ROS     Musculoskeletal   Abdominal   Peds  Hematology negative hematology ROS (+)   Anesthesia Other Findings   Reproductive/Obstetrics                            Anesthesia Physical Anesthesia Plan  ASA: II and emergent  Anesthesia Plan: General   Post-op Pain Management:    Induction: Intravenous, Rapid sequence and Cricoid pressure planned  Airway Management Planned: Oral ETT  Additional Equipment:   Intra-op Plan:   Post-operative Plan: Extubation in OR  Informed Consent: I have reviewed the patients History and Physical, chart, labs and discussed the procedure including the risks, benefits and alternatives for the proposed anesthesia with the patient or authorized representative who has indicated his/her understanding and acceptance.   Dental advisory given  Plan Discussed with: CRNA  Anesthesia Plan Comments:         Anesthesia Quick Evaluation

## 2015-11-17 NOTE — ED Notes (Signed)
Patient transported to CT 

## 2015-11-17 NOTE — Anesthesia Procedure Notes (Signed)
Procedure Name: Intubation Date/Time: 11/17/2015 5:21 PM Performed by: Maxwell Caul Pre-anesthesia Checklist: Patient identified, Emergency Drugs available, Suction available and Patient being monitored Patient Re-evaluated:Patient Re-evaluated prior to inductionOxygen Delivery Method: Circle system utilized Preoxygenation: Pre-oxygenation with 100% oxygen Intubation Type: IV induction, Rapid sequence and Cricoid Pressure applied Laryngoscope Size: Mac and 4 Grade View: Grade II Tube type: Oral Tube size: 7.5 mm Number of attempts: 1 Airway Equipment and Method: Stylet Placement Confirmation: ETT inserted through vocal cords under direct vision,  positive ETCO2 and breath sounds checked- equal and bilateral Secured at: 21 cm Tube secured with: Tape Dental Injury: Teeth and Oropharynx as per pre-operative assessment

## 2015-11-17 NOTE — H&P (Signed)
Brandi Gonzales is an 60 y.o. female.   Chief Complaint: abd pain HPI: 60 y.o. F alcoholic who presents to the ED with sudden onset severe abd pain.  She denies any chronic NSAID use.  She does drink daily between 3-5 beers a day.    Past Medical History:  Diagnosis Date  . Acid reflux   . Alcoholism, chronic ([place])   . Depression     Past Surgical History:  Procedure Laterality Date  . TUBAL LIGATION      History reviewed. No pertinent family history. Social History:  reports that she has been smoking.  She has never used smokeless tobacco. She reports that she drinks about 24.0 oz of alcohol per week . She reports that she does not use drugs.  Allergies:  Allergies  Allergen Reactions  . Compazine [Prochlorperazine] Swelling and Other (See Comments)    Makes eyes roll into the back of head     (Not in a hospital admission)  Results for orders placed or performed during the hospital encounter of 11/17/15 (from the past 48 hour(s))  Urinalysis, Routine w reflex microscopic (not at [facility])     Status: Abnormal   Collection Time: 11/17/15 11:19 AM  Result Value Ref Range   Color, Urine YELLOW YELLOW   APPearance CLOUDY (A) CLEAR   Specific Gravity, Urine 1.016 1.005 - 1.030   pH 7.0 5.0 - 8.0   Glucose, UA NEGATIVE NEGATIVE mg/dL   Hgb urine dipstick NEGATIVE NEGATIVE   Bilirubin Urine NEGATIVE NEGATIVE   Ketones, ur NEGATIVE NEGATIVE mg/dL   Protein, ur NEGATIVE NEGATIVE mg/dL   Nitrite NEGATIVE NEGATIVE   Leukocytes, UA NEGATIVE NEGATIVE    Comment: MICROSCOPIC NOT DONE ON URINES WITH NEGATIVE PROTEIN, BLOOD, LEUKOCYTES, NITRITE, OR GLUCOSE <1000 mg/dL.  CBC with Differential/Platelet     Status: None   Collection Time: 11/17/15 11:45 AM  Result Value Ref Range   WBC 6.7 4.0 - 10.5 K/uL   RBC 4.70 3.87 - 5.11 MIL/uL   Hemoglobin 14.8 12.0 - 15.0 g/dL   HCT 42.0 36.0 - 46.0 %   MCV 89.4 78.0 - 100.0 fL   MCH 31.5 26.0 - 34.0 pg   MCHC 35.2 30.0 - 36.0 g/dL   RDW 12.2  11.5 - 15.5 %   Platelets 204 150 - 400 K/uL   Neutrophils Relative % 79 %   Neutro Abs 5.3 1.7 - 7.7 K/uL   Lymphocytes Relative 17 %   Lymphs Abs 1.2 0.7 - 4.0 K/uL   Monocytes Relative 4 %   Monocytes Absolute 0.3 0.1 - 1.0 K/uL   Eosinophils Relative 0 %   Eosinophils Absolute 0.0 0.0 - 0.7 K/uL   Basophils Relative 0 %   Basophils Absolute 0.0 0.0 - 0.1 K/uL  Comprehensive metabolic panel     Status: Abnormal   Collection Time: 11/17/15 11:45 AM  Result Value Ref Range   Sodium 132 (L) 135 - 145 mmol/L   Potassium 3.9 3.5 - 5.1 mmol/L   Chloride 91 (L) 101 - 111 mmol/L   CO2 31 22 - 32 mmol/L   Glucose, Bld 96 65 - 99 mg/dL   BUN 12 6 - 20 mg/dL   Creatinine, Ser 0.67 0.44 - 1.00 mg/dL   Calcium 9.4 8.9 - 10.3 mg/dL   Total Protein 6.7 6.5 - 8.1 g/dL   Albumin 4.3 3.5 - 5.0 g/dL   AST 28 15 - 41 U/L   ALT 22 14 - 54 U/L  Alkaline Phosphatase 51 38 - 126 U/L   Total Bilirubin 0.9 0.3 - 1.2 mg/dL   GFR calc non Af Amer >60 >60 mL/min   GFR calc Af Amer >60 >60 mL/min    Comment: (NOTE) The eGFR has been calculated using the CKD EPI equation. This calculation has not been validated in all clinical situations. eGFR's persistently <60 mL/min signify possible Chronic Kidney Disease.    Anion gap 10 5 - 15  Lipase, blood     Status: None   Collection Time: 11/17/15 11:45 AM  Result Value Ref Range   Lipase 48 11 - 51 U/L  Protime-INR     Status: None   Collection Time: 11/17/15  3:02 PM  Result Value Ref Range   Prothrombin Time 13.6 11.4 - 15.2 seconds   INR 1.03   Type and screen     Status: None (Preliminary result)   Collection Time: 11/17/15  3:44 PM  Result Value Ref Range   ABO/RH(D) A POS    Antibody Screen PENDING    Sample Expiration 11/20/2015    Ct Abdomen Pelvis W Contrast  Result Date: 11/17/2015 CLINICAL DATA:  Abdominal pain that started suddenly this morning and localized to the left upper and lower quadrants. EXAM: CT ABDOMEN AND PELVIS WITH  CONTRAST TECHNIQUE: Multidetector CT imaging of the abdomen and pelvis was performed using the standard protocol following bolus administration of intravenous contrast. CONTRAST:  111m ISOVUE-300 IOPAMIDOL (ISOVUE-300) INJECTION 61% COMPARISON:  07/20/2013. FINDINGS: Lower chest: Mild circumferential wall thickening noted distal esophagus (image 1 series 2) in this patient with a small hiatal hernia. Hepatobiliary: 6 mm hypo attenuating lesion in the medial segment left liver is stable, compatible with a tiny cyst. Probable stable tiny cholesterol polyp in the gallbladder. No intrahepatic or extrahepatic biliary dilation. Pancreas: No focal mass lesion. No dilatation of the main duct. No intraparenchymal cyst. No peripancreatic edema. Spleen: No splenomegaly. No focal mass lesion. Adrenals/Urinary Tract: 11 mm left adrenal nodule unchanged in the 2.5 year history. Right adrenal gland is normal in appearance. Right kidney is unremarkable. 7 mm exophytic low-density lesion interpolar left kidney is unchanged, likely a tiny cyst. There is no adnexal mass. The urinary bladder appears normal for the degree of distention. Stomach/Bowel: Small hiatal hernia noted. Stomach otherwise unremarkable. Duodenum is normally positioned as is the ligament of Treitz. No small bowel wall thickening. No small bowel dilatation. The terminal ileum is normal. Although not well seen, the appendix is visible on image 51 series 2 and has normal CT imaging features. No gross colonic mass. No colonic wall thickening. No substantial diverticular change. Vascular/Lymphatic: There is abdominal aortic atherosclerosis without aneurysm. There is no gastrohepatic or hepatoduodenal ligament lymphadenopathy. No intraperitoneal or retroperitoneal lymphadenopathy. Portal vein and superior mesenteric vein are patent. Celiac axis, superior mesenteric artery, and inferior mesenteric artery all opacified. 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  Reproductive: The uterus has normal CT imaging appearance. There is no adnexal mass. Other: Small volume intraperitoneal free fluid evident in the abdomen and pelvis. There is some vascular collateralization in the left upper quadrant anterior to the stomach, indeterminate, but portal venous hypertension would be a consideration. Small volume intraperitoneal free air is identified adjacent to the liver and under the anterior abdominal wall. Musculoskeletal: Bone windows reveal no worrisome lytic or sclerotic osseous lesions. IMPRESSION: 1. Small volume intraperitoneal free air, consistent with hollow viscus perforation. The site of perforation is not evident by CT imaging today. 2. Prominent venous collateralization  in the left upper quadrant with perigastric and paraesophageal varices. Although the liver does not appear overtly cirrhotic by imaging, the presence of varices suggests portal venous hypertension. 3. Circumferential wall thickening in the distal esophagus, incompletely visualized. Small hiatal hernia associated. Changes in the esophagus may be related to esophagitis, but neoplasm could have this appearance. 4. Stable appearance of probable tiny cholesterol polyp in the gallbladder. Critical Value/emergent results were called by telephone at the time of interpretation on 11/17/2015 at 2:56 pm to Dr. Tanna Furry , who verbally acknowledged these results. Electronically Signed   By: Misty Stanley M.D.   On: 11/17/2015 14:56    Review of Systems  Constitutional: Negative for chills and fever.  Eyes: Negative for blurred vision and double vision.  Respiratory: Negative for cough and shortness of breath.   Cardiovascular: Negative for chest pain and palpitations.  Gastrointestinal: Positive for abdominal pain. Negative for blood in stool, nausea and vomiting.  Genitourinary: Negative for dysuria, frequency and urgency.  Musculoskeletal: Negative for myalgias.  Skin: Negative for rash.  Neurological:  Negative for dizziness and headaches.    Blood pressure 145/83, pulse 105, temperature 98.3 F (36.8 C), resp. rate 24, height 5' 4"  (1.626 m), weight 56.7 kg (125 lb), SpO2 93 %. Physical Exam  Constitutional: She is oriented to person, place, and time. She appears well-developed and well-nourished. She appears distressed.  HENT:  Head: Normocephalic and atraumatic.  Eyes: Conjunctivae and EOM are normal. Pupils are equal, round, and reactive to light.  Neck: Normal range of motion. Neck supple.  Cardiovascular: Regular rhythm.   Respiratory: Effort normal and breath sounds normal. No respiratory distress.  GI: She exhibits no distension. There is tenderness. There is rebound and guarding.  Musculoskeletal: Normal range of motion.  Neurological: She is alert and oriented to person, place, and time.  Skin: Skin is warm and dry. She is not diaphoretic.  Psychiatric: She has a normal mood and affect. Her behavior is normal.     Assessment/Plan 60 y.o. F with perforated viscus.  It appears to be an upper GI source on CT.  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  I discussed with the family that there is a significant risk of hemorrhage and death during or after surgery due to her liver disease.  We discussed the possibility of liver failure after surgery as well.  We discussed the need for possible bowel resection and a small chance of ostomy.  She and her family appear to understand that this is a life threatening event.   The surgery and anatomy were described to the patient as well as the risks of surgery and the possible complications.  These include: Bleeding, deep abdominal infections and possible wound complications such as hernia and infection, damage to adjacent structures, leak of surgical connections, which can lead to other surgeries and possibly an ostomy, possible need for other procedures, such as abscess drains in radiology, possible prolonged  hospital stay, possible constipation from narcotics, prolonged fatigue/weakness or appetite loss, possible complications of their medical problems such as heart disease or arrhythmias or lung problems, death (less than 1%). I believe the patient understands and wishes to proceed with the surgery.   Rosario Adie., MD 3/70/4888, 4:35 PM

## 2015-11-18 ENCOUNTER — Encounter (HOSPITAL_COMMUNITY): Payer: Self-pay | Admitting: Surgery

## 2015-11-18 DIAGNOSIS — F1020 Alcohol dependence, uncomplicated: Secondary | ICD-10-CM | POA: Diagnosis present

## 2015-11-18 DIAGNOSIS — Z566 Other physical and mental strain related to work: Secondary | ICD-10-CM

## 2015-11-18 DIAGNOSIS — Z720 Tobacco use: Secondary | ICD-10-CM

## 2015-11-18 DIAGNOSIS — K219 Gastro-esophageal reflux disease without esophagitis: Secondary | ICD-10-CM | POA: Diagnosis present

## 2015-11-18 LAB — BASIC METABOLIC PANEL
Anion gap: 6 (ref 5–15)
BUN: 7 mg/dL (ref 6–20)
CHLORIDE: 97 mmol/L — AB (ref 101–111)
CO2: 30 mmol/L (ref 22–32)
Calcium: 8.8 mg/dL — ABNORMAL LOW (ref 8.9–10.3)
Creatinine, Ser: 0.68 mg/dL (ref 0.44–1.00)
GFR calc non Af Amer: >60 mL/min (ref >60)
Glucose, Bld: 164 mg/dL — ABNORMAL HIGH (ref 65–99)
POTASSIUM: 4 mmol/L (ref 3.5–5.1)
SODIUM: 133 mmol/L — AB (ref 135–145)

## 2015-11-18 LAB — CBC
HCT: 40.5 % (ref 36.0–46.0)
HEMOGLOBIN: 14.3 g/dL (ref 12.0–15.0)
MCH: 31.9 pg (ref 26.0–34.0)
MCHC: 35.3 g/dL (ref 30.0–36.0)
MCV: 90.4 fL (ref 78.0–100.0)
Platelets: 194 10*3/uL (ref 150–400)
RBC: 4.48 MIL/uL (ref 3.87–5.11)
RDW: 12.3 % (ref 11.5–15.5)
WBC: 11.4 10*3/uL — ABNORMAL HIGH (ref 4.0–10.5)

## 2015-11-18 MED ORDER — ONDANSETRON HCL 4 MG/2ML IJ SOLN
4.0000 mg | Freq: Four times a day (QID) | INTRAMUSCULAR | Status: DC | PRN
Start: 2015-11-18 — End: 2015-11-18

## 2015-11-18 MED ORDER — LACTATED RINGERS IV BOLUS (SEPSIS): 1000.0000 mL | Freq: Three times a day (TID) | INTRAVENOUS | Status: AC | PRN

## 2015-11-18 MED ORDER — BISACODYL 10 MG RE SUPP: 10.0000 mg | Freq: Two times a day (BID) | RECTAL | Status: DC | PRN

## 2015-11-18 MED ORDER — HYDROMORPHONE 1 MG/ML IV SOLN
INTRAVENOUS | Status: DC
  Administered 2015-11-18 (×2): 1.2 mg via INTRAVENOUS
  Administered 2015-11-18 (×2): via INTRAVENOUS
  Administered 2015-11-19: 0.3 mg via INTRAVENOUS
  Administered 2015-11-19: 1.5 mg via INTRAVENOUS
  Administered 2015-11-19: 2.4 mg via INTRAVENOUS
  Administered 2015-11-19: 1.2 mg via INTRAVENOUS
  Administered 2015-11-19: 0.9 mg via INTRAVENOUS
  Administered 2015-11-19: 2.9 mg via INTRAVENOUS
  Administered 2015-11-20: 0.6 mg via INTRAVENOUS
  Administered 2015-11-20: 0.9 mg via INTRAVENOUS
  Administered 2015-11-20: 0.3 mg via INTRAVENOUS
  Administered 2015-11-20: 0.6 mg via INTRAVENOUS
  Administered 2015-11-20: 1.2 mg via INTRAVENOUS
  Administered 2015-11-20: 0.9 mg via INTRAVENOUS
  Administered 2015-11-21: 0.3 mg via INTRAVENOUS
  Administered 2015-11-21: 0.6 mg via INTRAVENOUS
  Filled 2015-11-18: qty 25

## 2015-11-18 MED ORDER — NALOXONE HCL 0.4 MG/ML IJ SOLN: 0.4000 mg | INTRAMUSCULAR | Status: DC | PRN

## 2015-11-18 MED ORDER — LIP MEDEX EX OINT
1.0000 "application " | TOPICAL_OINTMENT | Freq: Two times a day (BID) | CUTANEOUS | Status: DC
  Administered 2015-11-18 – 2015-11-23 (×9): 1 via TOPICAL
  Filled 2015-11-18 (×2): qty 7

## 2015-11-18 MED ORDER — DIPHENHYDRAMINE HCL 50 MG/ML IJ SOLN
12.5000 mg | Freq: Four times a day (QID) | INTRAMUSCULAR | Status: DC | PRN
  Administered 2015-11-18: 12.5 mg via INTRAVENOUS
  Filled 2015-11-18: qty 1

## 2015-11-18 MED ORDER — DIPHENHYDRAMINE HCL 12.5 MG/5ML PO ELIX: 12.5000 mg | ORAL_SOLUTION | Freq: Four times a day (QID) | ORAL | Status: DC | PRN

## 2015-11-18 MED ORDER — SODIUM CHLORIDE 0.9% FLUSH: 9.0000 mL | INTRAVENOUS | Status: DC | PRN

## 2015-11-18 MED ORDER — MAGIC MOUTHWASH
15.0000 mL | Freq: Four times a day (QID) | ORAL | Status: DC | PRN
  Administered 2015-11-18: 15 mL via ORAL
  Filled 2015-11-18 (×2): qty 15

## 2015-11-18 MED ORDER — MENTHOL 3 MG MT LOZG: 1.0000 | LOZENGE | OROMUCOSAL | Status: DC | PRN

## 2015-11-18 MED ORDER — NICOTINE 14 MG/24HR TD PT24
14.0000 mg | MEDICATED_PATCH | Freq: Every day | TRANSDERMAL | Status: DC
  Administered 2015-11-18 – 2015-11-21 (×4): 14 mg via TRANSDERMAL
  Filled 2015-11-18 (×4): qty 1

## 2015-11-18 MED ORDER — METHOCARBAMOL 1000 MG/10ML IJ SOLN
1000.0000 mg | Freq: Four times a day (QID) | INTRAVENOUS | Status: DC | PRN
  Filled 2015-11-18: qty 10

## 2015-11-18 MED ORDER — PHENOL 1.4 % MT LIQD
2.0000 | OROMUCOSAL | Status: DC | PRN
  Administered 2015-11-18: 2 via OROMUCOSAL
  Filled 2015-11-18: qty 177

## 2015-11-18 MED ORDER — ACETAMINOPHEN 650 MG RE SUPP: 650.0000 mg | Freq: Four times a day (QID) | RECTAL | Status: DC | PRN

## 2015-11-18 MED ORDER — NICOTINE 14 MG/24HR TD PT24: 14.0000 mg | MEDICATED_PATCH | Freq: Every day | TRANSDERMAL | 0 refills | Status: DC

## 2015-11-18 MED ORDER — METOPROLOL TARTRATE 5 MG/5ML IV SOLN
5.0000 mg | Freq: Four times a day (QID) | INTRAVENOUS | Status: DC | PRN
  Administered 2015-11-19 – 2015-11-20 (×2): 5 mg via INTRAVENOUS
  Filled 2015-11-18 (×2): qty 5

## 2015-11-18 MED ORDER — MORPHINE SULFATE (PF) 2 MG/ML IV SOLN: 2.0000 mg | INTRAVENOUS | Status: DC | PRN

## 2015-11-18 NOTE — Progress Notes (Signed)
[facility]  [address] [place], [place] [address] Phone: [phone] FAX: [phone]   MIHAELA FAJARDO [id number] 1955/10/26  CARE TEAM:  PCP: No PCP Per Patient  Outpatient Care Team: Patient Care Team: No Pcp Per Patient as PCP - General (General Practice)  Inpatient Treatment Team: Treatment Team: Attending Provider: Nolon Nations, MD; Registered Nurse: Alfonse Spruce, RN; Registered Nurse: Bobbye Charleston, RN  Problem List:   Principal Problem:   Perforated gastric ulcer s/p omental patch 11/17/2015 Active Problems:   Major depression ([place])   Acid reflux   Alcoholism, chronic ([place])   1 Day Post-Op  11/17/2015  Procedure: EXPLORATORY LAPAROTOMY OMENTAL "GRAHAM" PATCH ANTRAL ULCER   Assessment  Stabilizing  Plan:  -NGT until flatus, then clamping trials (expect 2-7 days)  -IV ABx  -IVF  -PPI / H2B acid suppression  -most likely would benefit from EGD in 6-8 weeks to make sure the ulcer has healed.    -CIWA protocol for h/o EtOH use  -I strongly recommend she avoid risk factors such as tobacco, NSAIDs & especially EtOH to allow the ulcer to heal & not recur.  She seems convinced that stress is the culprit - certainly a contributor but usually not a cause of perforation by itself.  -VTE prophylaxis- SCDs, etc  -mobilize as tolerated to help recovery  [name], M.D., F.A.C.S. Gastrointestinal and Minimally Invasive Surgery [facility] [address] [place], [place] [address] [phone] Main / Paging   11/18/2015  Subjective:  Pain less than pre-op but very sore - PCA helping Wants NGT out Many questions - worried about missing work, etc Family at bedside  Objective:  Vital signs:  Vitals:   11/18/15 0530 11/18/15 0600 11/18/15 0630 11/18/15 0755  BP: 128/76 128/76 106/68   Pulse:      Resp: 13 17 10 19   Temp:      TempSrc:      SpO2: 99% 97% 96% 98%   Weight:      Height:        Last BM Date:  (PTA)  Intake/Output   Yesterday:  08/27 0701 - 08/28 0700 In: 4832.9 [I.V.:4702.9; NG/GT:30; IV Piggyback:100] Out: 1075 [Urine:1025; Blood:50] This shift:  No intake/output data recorded.  Bowel function:  Flatus: No  BM:  No  Drain: Thinly bilious   Physical Exam:  General: Pt awake/alert/oriented x4 in mild acute distress Eyes: PERRL, normal EOM.  Sclera clear.  No icterus Neuro: CN II-XII intact w/o focal sensory/motor deficits. Lymph: No head/neck/groin lymphadenopathy Psych:  No delerium/psychosis/paranoia HENT: Normocephalic, Mucus membranes moist.  No thrush Neck: Supple, No tracheal deviation Chest: No chest wall pain w good excursion CV:  Pulses intact.  Regular rhythm MS: Normal AROM mjr joints.  No obvious deformity Abdomen: Somewhat firm.  Mildy distended.  Tenderness at central/epigastric abdomen.  No evidence of peritonitis.  No incarcerated hernias. Ext:  SCDs BLE.  No mjr edema.  No cyanosis Skin: No petechiae / purpura  Results:   Labs: Results for orders placed or performed during the hospital encounter of 11/17/15 (from the past 48 hour(s))  Urinalysis, Routine w reflex microscopic (not at [facility])     Status: Abnormal   Collection Time: 11/17/15 11:19 AM  Result Value Ref Range   Color, Urine YELLOW YELLOW   APPearance CLOUDY (A) CLEAR   Specific Gravity, Urine 1.016 1.005 - 1.030   pH 7.0 5.0 - 8.0  Glucose, UA NEGATIVE NEGATIVE mg/dL   Hgb urine dipstick NEGATIVE NEGATIVE   Bilirubin Urine NEGATIVE NEGATIVE   Ketones, ur NEGATIVE NEGATIVE mg/dL   Protein, ur NEGATIVE NEGATIVE mg/dL   Nitrite NEGATIVE NEGATIVE   Leukocytes, UA NEGATIVE NEGATIVE    Comment: MICROSCOPIC NOT DONE ON URINES WITH NEGATIVE PROTEIN, BLOOD, LEUKOCYTES, NITRITE, OR GLUCOSE <1000 mg/dL.  CBC with Differential/Platelet     Status: None   Collection Time: 11/17/15 11:45 AM  Result Value Ref Range   WBC 6.7 4.0 - 10.5 K/uL    RBC 4.70 3.87 - 5.11 MIL/uL   Hemoglobin 14.8 12.0 - 15.0 g/dL   HCT 42.0 36.0 - 46.0 %   MCV 89.4 78.0 - 100.0 fL   MCH 31.5 26.0 - 34.0 pg   MCHC 35.2 30.0 - 36.0 g/dL   RDW 12.2 11.5 - 15.5 %   Platelets 204 150 - 400 K/uL   Neutrophils Relative % 79 %   Neutro Abs 5.3 1.7 - 7.7 K/uL   Lymphocytes Relative 17 %   Lymphs Abs 1.2 0.7 - 4.0 K/uL   Monocytes Relative 4 %   Monocytes Absolute 0.3 0.1 - 1.0 K/uL   Eosinophils Relative 0 %   Eosinophils Absolute 0.0 0.0 - 0.7 K/uL   Basophils Relative 0 %   Basophils Absolute 0.0 0.0 - 0.1 K/uL  Comprehensive metabolic panel     Status: Abnormal   Collection Time: 11/17/15 11:45 AM  Result Value Ref Range   Sodium 132 (L) 135 - 145 mmol/L   Potassium 3.9 3.5 - 5.1 mmol/L   Chloride 91 (L) 101 - 111 mmol/L   CO2 31 22 - 32 mmol/L   Glucose, Bld 96 65 - 99 mg/dL   BUN 12 6 - 20 mg/dL   Creatinine, Ser 0.67 0.44 - 1.00 mg/dL   Calcium 9.4 8.9 - 10.3 mg/dL   Total Protein 6.7 6.5 - 8.1 g/dL   Albumin 4.3 3.5 - 5.0 g/dL   AST 28 15 - 41 U/L   ALT 22 14 - 54 U/L   Alkaline Phosphatase 51 38 - 126 U/L   Total Bilirubin 0.9 0.3 - 1.2 mg/dL   GFR calc non Af Amer >60 >60 mL/min   GFR calc Af Amer >60 >60 mL/min    Comment: (NOTE) The eGFR has been calculated using the CKD EPI equation. This calculation has not been validated in all clinical situations. eGFR's persistently <60 mL/min signify possible Chronic Kidney Disease.    Anion gap 10 5 - 15  Lipase, blood     Status: None   Collection Time: 11/17/15 11:45 AM  Result Value Ref Range   Lipase 48 11 - 51 U/L  Protime-INR     Status: None   Collection Time: 11/17/15  3:02 PM  Result Value Ref Range   Prothrombin Time 13.6 11.4 - 15.2 seconds   INR 1.03   Surgical PCR screen     Status: Abnormal   Collection Time: 11/17/15  3:13 PM  Result Value Ref Range   MRSA, PCR NEGATIVE NEGATIVE   Staphylococcus aureus POSITIVE (A) NEGATIVE    Comment:        The Xpert SA Assay  (FDA approved for NASAL specimens in patients over 58 years of age), is one component of a comprehensive surveillance program.  Test performance has been validated by [facility] for patients greater than or equal to 58 year old. It is not intended to diagnose infection nor to  guide or monitor treatment.   ABO/Rh     Status: None   Collection Time: 11/17/15  3:40 PM  Result Value Ref Range   ABO/RH(D) A POS   Type and screen     Status: None   Collection Time: 11/17/15  3:44 PM  Result Value Ref Range   ABO/RH(D) A POS    Antibody Screen NEG    Sample Expiration 11/20/2015   CBC     Status: Abnormal   Collection Time: 11/18/15  3:23 AM  Result Value Ref Range   WBC 11.4 (H) 4.0 - 10.5 K/uL   RBC 4.48 3.87 - 5.11 MIL/uL   Hemoglobin 14.3 12.0 - 15.0 g/dL   HCT 40.5 36.0 - 46.0 %   MCV 90.4 78.0 - 100.0 fL   MCH 31.9 26.0 - 34.0 pg   MCHC 35.3 30.0 - 36.0 g/dL   RDW 12.3 11.5 - 15.5 %   Platelets 194 150 - 400 K/uL  Basic metabolic panel     Status: Abnormal   Collection Time: 11/18/15  3:23 AM  Result Value Ref Range   Sodium 133 (L) 135 - 145 mmol/L   Potassium 4.0 3.5 - 5.1 mmol/L   Chloride 97 (L) 101 - 111 mmol/L   CO2 30 22 - 32 mmol/L   Glucose, Bld 164 (H) 65 - 99 mg/dL   BUN 7 6 - 20 mg/dL   Creatinine, Ser 0.68 0.44 - 1.00 mg/dL   Calcium 8.8 (L) 8.9 - 10.3 mg/dL   GFR calc non Af Amer >60 >60 mL/min   GFR calc Af Amer >60 >60 mL/min    Comment: (NOTE) The eGFR has been calculated using the CKD EPI equation. This calculation has not been validated in all clinical situations. eGFR's persistently <60 mL/min signify possible Chronic Kidney Disease.    Anion gap 6 5 - 15    Imaging / Studies: Ct Abdomen Pelvis W Contrast  Result Date: 11/17/2015 CLINICAL DATA:  Abdominal pain that started suddenly this morning and localized to the left upper and lower quadrants. EXAM: CT ABDOMEN AND PELVIS WITH CONTRAST TECHNIQUE: Multidetector CT imaging of the abdomen  and pelvis was performed using the standard protocol following bolus administration of intravenous contrast. CONTRAST:  113m ISOVUE-300 IOPAMIDOL (ISOVUE-300) INJECTION 61% COMPARISON:  07/20/2013. FINDINGS: Lower chest: Mild circumferential wall thickening noted distal esophagus (image 1 series 2) in this patient with a small hiatal hernia. Hepatobiliary: 6 mm hypo attenuating lesion in the medial segment left liver is stable, compatible with a tiny cyst. Probable stable tiny cholesterol polyp in the gallbladder. No intrahepatic or extrahepatic biliary dilation. Pancreas: No focal mass lesion. No dilatation of the main duct. No intraparenchymal cyst. No peripancreatic edema. Spleen: No splenomegaly. No focal mass lesion. Adrenals/Urinary Tract: 11 mm left adrenal nodule unchanged in the 2.5 year history. Right adrenal gland is normal in appearance. Right kidney is unremarkable. 7 mm exophytic low-density lesion interpolar left kidney is unchanged, likely a tiny cyst. There is no adnexal mass. The urinary bladder appears normal for the degree of distention. Stomach/Bowel: Small hiatal hernia noted. Stomach otherwise unremarkable. Duodenum is normally positioned as is the ligament of Treitz. No small bowel wall thickening. No small bowel dilatation. The terminal ileum is normal. Although not well seen, the appendix is visible on image 51 series 2 and has normal CT imaging features. No Durante Violett colonic mass. No colonic wall thickening. No substantial diverticular change. Vascular/Lymphatic: There is abdominal aortic atherosclerosis without aneurysm. There  is no gastrohepatic or hepatoduodenal ligament lymphadenopathy. No intraperitoneal or retroperitoneal lymphadenopathy. Portal vein and superior mesenteric vein are patent. Celiac axis, superior mesenteric artery, and inferior mesenteric artery all opacified. 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 Reproductive: The uterus has normal CT imaging appearance.  There is no adnexal mass. Other: Small volume intraperitoneal free fluid evident in the abdomen and pelvis. There is some vascular collateralization in the left upper quadrant anterior to the stomach, indeterminate, but portal venous hypertension would be a consideration. Small volume intraperitoneal free air is identified adjacent to the liver and under the anterior abdominal wall. Musculoskeletal: Bone windows reveal no worrisome lytic or sclerotic osseous lesions. IMPRESSION: 1. Small volume intraperitoneal free air, consistent with hollow viscus perforation. The site of perforation is not evident by CT imaging today. 2. Prominent venous collateralization in the left upper quadrant with perigastric and paraesophageal varices. Although the liver does not appear overtly cirrhotic by imaging, the presence of varices suggests portal venous hypertension. 3. Circumferential wall thickening in the distal esophagus, incompletely visualized. Small hiatal hernia associated. Changes in the esophagus may be related to esophagitis, but neoplasm could have this appearance. 4. Stable appearance of probable tiny cholesterol polyp in the gallbladder. Critical Value/emergent results were called by telephone at the time of interpretation on 11/17/2015 at 2:56 pm to Dr. Tanna Furry , who verbally acknowledged these results. Electronically Signed   By: Misty Stanley M.D.   On: 11/17/2015 14:56    Medications / Allergies: per chart  Antibiotics: Anti-infectives    Start     Dose/Rate Route Frequency Ordered Stop   11/18/15 1700  ertapenem (INVANZ) 1 g in sodium chloride 0.9 % 50 mL IVPB     1 g 100 mL/hr over 30 Minutes Intravenous Every 24 hours 11/17/15 2022     11/17/15 1645  ertapenem (INVANZ) 1 g in sodium chloride 0.9 % 50 mL IVPB  Status:  Discontinued     1,000 mg 100 mL/hr over 30 Minutes Intravenous On call to O.R. 11/17/15 1634 11/17/15 2019        Note: Portions of this report may have been transcribed  using voice recognition software. Every effort was made to ensure accuracy; however, inadvertent computerized transcription errors may be present.   Any transcriptional errors that result from this process are unintentional.     [name], M.D., F.A.C.S. Gastrointestinal and Minimally Invasive Surgery [facility] [address] [place] [place], [place] [address] [phone] Main / Paging   11/18/2015

## 2015-11-19 LAB — CBC
HEMATOCRIT: 39.1 % (ref 36.0–46.0)
Hemoglobin: 13.1 g/dL (ref 12.0–15.0)
MCH: 31.4 pg (ref 26.0–34.0)
MCHC: 33.5 g/dL (ref 30.0–36.0)
MCV: 93.8 fL (ref 78.0–100.0)
Platelets: 181 10*3/uL (ref 150–400)
RBC: 4.17 MIL/uL (ref 3.87–5.11)
RDW: 12.4 % (ref 11.5–15.5)
WBC: 7 10*3/uL (ref 4.0–10.5)

## 2015-11-19 LAB — BASIC METABOLIC PANEL
ANION GAP: 5 (ref 5–15)
BUN: 8 mg/dL (ref 6–20)
CHLORIDE: 95 mmol/L — AB (ref 101–111)
CO2: 32 mmol/L (ref 22–32)
CREATININE: 0.61 mg/dL (ref 0.44–1.00)
Calcium: 8.7 mg/dL — ABNORMAL LOW (ref 8.9–10.3)
GFR calc non Af Amer: >60 mL/min (ref >60)
Glucose, Bld: 86 mg/dL (ref 65–99)
Potassium: 4.4 mmol/L (ref 3.5–5.1)
SODIUM: 132 mmol/L — AB (ref 135–145)

## 2015-11-19 NOTE — Progress Notes (Signed)
Patient ID: Brandi Gonzales, female   DOB: September 27, 1955, 60 y.o.   MRN: [id number]  [facility] Surgery Progress Note  2 Days Post-Op  Subjective: Feeling better today. Walked halls earlier this morning. Abdominal pain is decreasing. Denies n/v. States that she feels hungry and her stomach is rumbling, but she has not had a BM or passed any gas yet.  Objective: Vital signs in last 24 hours: Temp:  [98.2 F (36.8 C)-98.9 F (37.2 C)] 98.9 F (37.2 C) (08/29 0900) Pulse Rate:  [76-110] 76 (08/29 0900) Resp:  [10-26] 14 (08/29 0900) BP: (118-152)/(61-91) 127/71 (08/29 0900) SpO2:  [92 %-100 %] 97 % (08/29 0900) Weight:  [132 lb 7.9 oz (60.1 kg)] 132 lb 7.9 oz (60.1 kg) (08/29 0558) Last BM Date:  (PTA)  Intake/Output from previous day: 08/28 0701 - 08/29 0700 In: 2451.8 [I.V.:2301.8; IV Piggyback:150] Out: 725 [Urine:725] Intake/Output this shift: No intake/output data recorded.  PE: Gen:  Alert, NAD, pleasant Card:  RRR, no M/G/R heard Pulm:  CTAB Abd: Soft, ND, +BS, minimally tender, dry dressing over midline incision with minimal dry blood noted  NG tube output   Lab Results:   Recent Labs  11/18/15 0323 11/19/15 0450  WBC 11.4* 7.0  HGB 14.3 13.1  HCT 40.5 39.1  PLT 194 181   BMET  Recent Labs  11/18/15 0323 11/19/15 0450  NA 133* 132*  K 4.0 4.4  CL 97* 95*  CO2 30 32  GLUCOSE 164* 86  BUN 7 8  CREATININE 0.68 0.61  CALCIUM 8.8* 8.7*   PT/INR  Recent Labs  11/17/15 1502  LABPROT 13.6  INR 1.03   CMP     Component Value Date/Time   NA 132 (L) 11/19/2015 0450   NA 133 (L) 07/20/2013 0051   K 4.4 11/19/2015 0450   K 3.8 07/20/2013 0051   CL 95 (L) 11/19/2015 0450   CL 96 (L) 07/20/2013 0051   CO2 32 11/19/2015 0450   CO2 29 07/20/2013 0051   GLUCOSE 86 11/19/2015 0450   GLUCOSE 78 07/20/2013 0051   BUN 8 11/19/2015 0450   BUN 6 (L) 07/20/2013 0051   CREATININE 0.61 11/19/2015 0450   CREATININE 0.40 (L) 07/20/2013 0051   CALCIUM 8.7  (L) 11/19/2015 0450   CALCIUM 9.5 07/20/2013 0051   PROT 6.7 11/17/2015 1145   PROT 7.1 07/20/2013 0051   ALBUMIN 4.3 11/17/2015 1145   ALBUMIN 4.2 07/20/2013 0051   AST 28 11/17/2015 1145   AST 31 07/20/2013 0051   ALT 22 11/17/2015 1145   ALT 25 07/20/2013 0051   ALKPHOS 51 11/17/2015 1145   ALKPHOS 74 07/20/2013 0051   BILITOT 0.9 11/17/2015 1145   BILITOT 0.4 07/20/2013 0051   GFRNONAA >60 11/19/2015 0450   GFRNONAA >60 07/20/2013 0051   GFRAA >60 11/19/2015 0450   GFRAA >60 07/20/2013 0051   Lipase     Component Value Date/Time   LIPASE 48 11/17/2015 1145   LIPASE 214 07/20/2013 0051       Studies/Results: Ct Abdomen Pelvis W Contrast  Result Date: 11/17/2015 CLINICAL DATA:  Abdominal pain that started suddenly this morning and localized to the left upper and lower quadrants. EXAM: CT ABDOMEN AND PELVIS WITH CONTRAST TECHNIQUE: Multidetector CT imaging of the abdomen and pelvis was performed using the standard protocol following bolus administration of intravenous contrast. CONTRAST:  161mL ISOVUE-300 IOPAMIDOL (ISOVUE-300) INJECTION 61% COMPARISON:  07/20/2013. FINDINGS: Lower chest: Mild circumferential wall thickening noted distal esophagus (image  1 series 2) in this patient with a small hiatal hernia. Hepatobiliary: 6 mm hypo attenuating lesion in the medial segment left liver is stable, compatible with a tiny cyst. Probable stable tiny cholesterol polyp in the gallbladder. No intrahepatic or extrahepatic biliary dilation. Pancreas: No focal mass lesion. No dilatation of the main duct. No intraparenchymal cyst. No peripancreatic edema. Spleen: No splenomegaly. No focal mass lesion. Adrenals/Urinary Tract: 11 mm left adrenal nodule unchanged in the 2.5 year history. Right adrenal gland is normal in appearance. Right kidney is unremarkable. 7 mm exophytic low-density lesion interpolar left kidney is unchanged, likely a tiny cyst. There is no adnexal mass. The urinary bladder  appears normal for the degree of distention. Stomach/Bowel: Small hiatal hernia noted. Stomach otherwise unremarkable. Duodenum is normally positioned as is the ligament of Treitz. No small bowel wall thickening. No small bowel dilatation. The terminal ileum is normal. Although not well seen, the appendix is visible on image 51 series 2 and has normal CT imaging features. No gross colonic mass. No colonic wall thickening. No substantial diverticular change. Vascular/Lymphatic: There is abdominal aortic atherosclerosis without aneurysm. There is no gastrohepatic or hepatoduodenal ligament lymphadenopathy. No intraperitoneal or retroperitoneal lymphadenopathy. Portal vein and superior mesenteric vein are patent. Celiac axis, superior mesenteric artery, and inferior mesenteric artery all opacified. 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 Reproductive: The uterus has normal CT imaging appearance. There is no adnexal mass. Other: Small volume intraperitoneal free fluid evident in the abdomen and pelvis. There is some vascular collateralization in the left upper quadrant anterior to the stomach, indeterminate, but portal venous hypertension would be a consideration. Small volume intraperitoneal free air is identified adjacent to the liver and under the anterior abdominal wall. Musculoskeletal: Bone windows reveal no worrisome lytic or sclerotic osseous lesions. IMPRESSION: 1. Small volume intraperitoneal free air, consistent with hollow viscus perforation. The site of perforation is not evident by CT imaging today. 2. Prominent venous collateralization in the left upper quadrant with perigastric and paraesophageal varices. Although the liver does not appear overtly cirrhotic by imaging, the presence of varices suggests portal venous hypertension. 3. Circumferential wall thickening in the distal esophagus, incompletely visualized. Small hiatal hernia associated. Changes in the esophagus may be related to  esophagitis, but neoplasm could have this appearance. 4. Stable appearance of probable tiny cholesterol polyp in the gallbladder. Critical Value/emergent results were called by telephone at the time of interpretation on 11/17/2015 at 2:56 pm to Dr. Tanna Furry , who verbally acknowledged these results. Electronically Signed   By: Misty Stanley M.D.   On: 11/17/2015 14:56    Anti-infectives: Anti-infectives    Start     Dose/Rate Route Frequency Ordered Stop   11/18/15 1700  ertapenem (INVANZ) 1 g in sodium chloride 0.9 % 50 mL IVPB     1 g 100 mL/hr over 30 Minutes Intravenous Every 24 hours 11/17/15 2022     11/17/15 1645  ertapenem (INVANZ) 1 g in sodium chloride 0.9 % 50 mL IVPB  Status:  Discontinued     1,000 mg 100 mL/hr over 30 Minutes Intravenous On call to O.R. 11/17/15 1634 11/17/15 2019       Assessment/Plan Procedure(s): EXPLORATORY LAPAROTOMY, GRAHAM PATCH ANTRAL ULCER 11/17/15 Dr. Marcello Moores - POD2 - no BM or flatus but does have borborygmi and is ambulating in halls. No n/v. Only 100cc NG output since yesterday. Continue NG tube until flatus - H. Pylori antibody IgG - PPI/H2B acid suppression - need EGD 6-8 weeks postop  to make sure ulcer has healed Alcoholism - CIWAA Tobacco abuse  GERD  ID - Invanz day 2 VTE - Lovenox, SCD's FEN - NPO excepts few sips of clears, IVF Plan - awaiting return of bowels, encouraged ambulation. D/c foley.   LOS: 2 days    Jerrye Beavers , [facility] Surgery 11/19/2015, 10:07 AM Pager: [phone] Consults: [phone] Mon-Fri 7:00 am-4:30 pm Sat-Sun 7:00 am-11:30 am

## 2015-11-19 NOTE — Progress Notes (Signed)
Pt having c/o edematous right upper lip stating that she feels it is progressively becoming more swollen. MD on call for [facility]CS paged and orders received to apply ice and use carmex until the morning for the MD/PA to assess. Patient reassured.    PCA pump beeping a great amount of time during the night bothering the patient and daughter. Tubing switched out, continued to beep. Pulse increased to 120 when patient was shivering because her room was too cold. Temperature change in room solved this. RR also becoming low. Encouraged pt to use incentive spirometer but pt does not like this due to pain when inspiring but educated pt and daughter on the importance of using this.

## 2015-11-20 LAB — H. PYLORI ANTIBODY, IGG: H PYLORI IGG: 2.6 U/mL — AB (ref 0.0–0.8)

## 2015-11-20 NOTE — Progress Notes (Signed)
Patient ID: Brandi Gonzales, female   DOB: [birth year], 60 y.o.   MRN: [id number]  [facility] Surgery Progress Note  3 Days Post-Op  Subjective: Continues to feel better each day. Burping but no flatus. Walking halls multiple times daily. Denies n/v.  Objective: Vital signs in last 24 hours: Temp:  [98.1 F (36.7 C)-98.9 F (37.2 C)] 98.1 F (36.7 C) (08/30 0623) Pulse Rate:  [75-112] 75 (08/30 0623) Resp:  [11-16] 12 (08/30 0800) BP: (135-151)/(64-79) 139/64 (08/30 0623) SpO2:  [92 %-100 %] 96 % (08/30 0800) Last BM Date:  (PTA)  Intake/Output from previous day: 08/29 0701 - 08/30 0700 In: 2000 [I.V.:1900; IV Piggyback:100] Out: 2775 [Urine:2650; Emesis/NG output:125] Intake/Output this shift: Total I/O In: -  Out: 400 [Urine:400]  PE: Gen:  Alert, NAD, pleasant Card:  RRR, no M/G/R heard Pulm:  CTAB Abd: Soft, ND, +BS, minimally tender, dry dressing over midline incision with minimal dry blood noted  NG tube output 400cc   Lab Results:   Recent Labs  11/18/15 0323 11/19/15 0450  WBC 11.4* 7.0  HGB 14.3 13.1  HCT 40.5 39.1  PLT 194 181   BMET  Recent Labs  11/18/15 0323 11/19/15 0450  NA 133* 132*  K 4.0 4.4  CL 97* 95*  CO2 30 32  GLUCOSE 164* 86  BUN 7 8  CREATININE 0.68 0.61  CALCIUM 8.8* 8.7*   PT/INR  Recent Labs  11/17/15 1502  LABPROT 13.6  INR 1.03   CMP     Component Value Date/Time   NA 132 (L) 11/19/2015 0450   NA 133 (L) 07/20/2013 0051   K 4.4 11/19/2015 0450   K 3.8 07/20/2013 0051   CL 95 (L) 11/19/2015 0450   CL 96 (L) 07/20/2013 0051   CO2 32 11/19/2015 0450   CO2 29 07/20/2013 0051   GLUCOSE 86 11/19/2015 0450   GLUCOSE 78 07/20/2013 0051   BUN 8 11/19/2015 0450   BUN 6 (L) 07/20/2013 0051   CREATININE 0.61 11/19/2015 0450   CREATININE 0.40 (L) 07/20/2013 0051   CALCIUM 8.7 (L) 11/19/2015 0450   CALCIUM 9.5 07/20/2013 0051   PROT 6.7 11/17/2015 1145   PROT 7.1 07/20/2013 0051   ALBUMIN 4.3 11/17/2015  1145   ALBUMIN 4.2 07/20/2013 0051   AST 28 11/17/2015 1145   AST 31 07/20/2013 0051   ALT 22 11/17/2015 1145   ALT 25 07/20/2013 0051   ALKPHOS 51 11/17/2015 1145   ALKPHOS 74 07/20/2013 0051   BILITOT 0.9 11/17/2015 1145   BILITOT 0.4 07/20/2013 0051   GFRNONAA >60 11/19/2015 0450   GFRNONAA >60 07/20/2013 0051   GFRAA >60 11/19/2015 0450   GFRAA >60 07/20/2013 0051   Lipase     Component Value Date/Time   LIPASE 48 11/17/2015 1145   LIPASE 214 07/20/2013 0051       Studies/Results: No results found.  Anti-infectives: Anti-infectives    Start     Dose/Rate Route Frequency Ordered Stop   11/18/15 1700  ertapenem (INVANZ) 1 g in sodium chloride 0.9 % 50 mL IVPB     1 g 100 mL/hr over 30 Minutes Intravenous Every 24 hours 11/17/15 2022     11/17/15 1645  ertapenem (INVANZ) 1 g in sodium chloride 0.9 % 50 mL IVPB  Status:  Discontinued     1,000 mg 100 mL/hr over 30 Minutes Intravenous On call to O.R. 11/17/15 1634 11/17/15 2019       Assessment/Plan Procedure(s): EXPLORATORY LAPAROTOMY,  GRAHAM PATCH ANTRAL ULCER 11/17/15 Dr. Marcello Moores - POD3 - no BM or flatus. No n/v. 400cc NG output since yesterday. Continue NG tube until flatus - H. Pylori antibody IgG pending - PPI/H2B acid suppression - need EGD 6-8 weeks postop to make sure ulcer has healed Alcoholism - CIWAA Tobacco abuse  GERD  ID - Invanz day 3 - plan for total of 4 days postop VTE - Lovenox, SCD's FEN - NPO except ice chips, IVF Plan - awaiting return of bowels. Continue ambulation. Will likely d/c PCA tomorrow and continue to wean pain medication. Labs tomorrow.   LOS: 3 days    Jerrye Beavers , [facility] Surgery 11/20/2015, 9:31 AM Pager: [phone] Consults: [phone] Mon-Fri 7:00 am-4:30 pm Sat-Sun 7:00 am-11:30 am

## 2015-11-21 ENCOUNTER — Inpatient Hospital Stay (HOSPITAL_COMMUNITY): Payer: Managed Care, Other (non HMO)

## 2015-11-21 DIAGNOSIS — B9681 Helicobacter pylori [H. pylori] as the cause of diseases classified elsewhere: Secondary | ICD-10-CM

## 2015-11-21 DIAGNOSIS — K253 Acute gastric ulcer without hemorrhage or perforation: Secondary | ICD-10-CM

## 2015-11-21 LAB — BASIC METABOLIC PANEL
ANION GAP: 10 (ref 5–15)
BUN: 7 mg/dL (ref 6–20)
CALCIUM: 8.5 mg/dL — AB (ref 8.9–10.3)
CO2: 31 mmol/L (ref 22–32)
Chloride: 93 mmol/L — ABNORMAL LOW (ref 101–111)
Creatinine, Ser: 0.58 mg/dL (ref 0.44–1.00)
Glucose, Bld: 73 mg/dL (ref 65–99)
Potassium: 3.2 mmol/L — ABNORMAL LOW (ref 3.5–5.1)
Sodium: 134 mmol/L — ABNORMAL LOW (ref 135–145)

## 2015-11-21 LAB — CBC
HCT: 38.2 % (ref 36.0–46.0)
HEMOGLOBIN: 13.4 g/dL (ref 12.0–15.0)
MCH: 31.1 pg (ref 26.0–34.0)
MCHC: 35.1 g/dL (ref 30.0–36.0)
MCV: 88.6 fL (ref 78.0–100.0)
Platelets: 190 10*3/uL (ref 150–400)
RBC: 4.31 MIL/uL (ref 3.87–5.11)
RDW: 11.6 % (ref 11.5–15.5)
WBC: 4.7 10*3/uL (ref 4.0–10.5)

## 2015-11-21 LAB — MAGNESIUM: MAGNESIUM: 1.6 mg/dL — AB (ref 1.7–2.4)

## 2015-11-21 MED ORDER — NICOTINE 21 MG/24HR TD PT24: 21.0000 mg | MEDICATED_PATCH | Freq: Every day | TRANSDERMAL | Status: DC

## 2015-11-21 MED ORDER — MAGNESIUM SULFATE 2 GM/50ML IV SOLN
2.0000 g | Freq: Once | INTRAVENOUS | Status: AC
  Administered 2015-11-21: 2 g via INTRAVENOUS
  Filled 2015-11-21: qty 50

## 2015-11-21 MED ORDER — IOPAMIDOL (ISOVUE-300) INJECTION 61%
150.0000 mL | Freq: Once | INTRAVENOUS | Status: AC | PRN
  Administered 2015-11-21: 150 mL via ORAL

## 2015-11-21 MED ORDER — LORAZEPAM 2 MG/ML IJ SOLN
1.0000 mg | INTRAMUSCULAR | Status: DC | PRN
  Administered 2015-11-21: 1 mg via INTRAVENOUS
  Filled 2015-11-21: qty 1

## 2015-11-21 MED ORDER — KCL IN DEXTROSE-NACL 40-5-0.9 MEQ/L-%-% IV SOLN
INTRAVENOUS | Status: DC
  Administered 2015-11-21: 11:00:00 via INTRAVENOUS
  Filled 2015-11-21 (×2): qty 1000

## 2015-11-21 MED ORDER — MORPHINE SULFATE (PF) 2 MG/ML IV SOLN
2.0000 mg | INTRAVENOUS | Status: DC | PRN
  Administered 2015-11-22: 2 mg via INTRAVENOUS
  Filled 2015-11-21: qty 1

## 2015-11-21 MED ORDER — ACETAMINOPHEN 10 MG/ML IV SOLN
1000.0000 mg | Freq: Four times a day (QID) | INTRAVENOUS | Status: DC
  Administered 2015-11-21: 1000 mg via INTRAVENOUS
  Filled 2015-11-21 (×2): qty 100

## 2015-11-21 MED ORDER — NICOTINE 21 MG/24HR TD PT24
21.0000 mg | MEDICATED_PATCH | Freq: Every day | TRANSDERMAL | Status: DC
  Administered 2015-11-21 – 2015-11-23 (×3): 21 mg via TRANSDERMAL
  Filled 2015-11-21 (×3): qty 1

## 2015-11-21 MED ORDER — PANTOPRAZOLE SODIUM 40 MG PO TBEC
80.0000 mg | DELAYED_RELEASE_TABLET | Freq: Two times a day (BID) | ORAL | Status: DC
  Administered 2015-11-21: 80 mg via ORAL
  Filled 2015-11-21: qty 2

## 2015-11-21 MED ORDER — ACETAMINOPHEN 500 MG PO TABS
1000.0000 mg | ORAL_TABLET | Freq: Three times a day (TID) | ORAL | Status: DC
  Administered 2015-11-21 – 2015-11-23 (×5): 1000 mg via ORAL
  Filled 2015-11-21 (×6): qty 2

## 2015-11-21 MED ORDER — HYDROMORPHONE HCL 1 MG/ML IJ SOLN: 0.5000 mg | INTRAMUSCULAR | Status: DC | PRN

## 2015-11-21 NOTE — Progress Notes (Signed)
4 Days Post-Op  Subjective: She knows where she is and that she had surgery.  Ativan knocked her out.  She is on that for CIWA protocol.  She needs her coffee too.  She has good bowel sounds, but no flatus.  She walked a few times yesterday, but stayed in bed otherwise.    Objective: Vital signs in last 24 hours: Temp:  [98 F (36.7 C)-98.8 F (37.1 C)] 98.3 F (36.8 C) (08/31 0509) Pulse Rate:  [70-90] 70 (08/31 0509) Resp:  [12-17] 16 (08/31 0800) BP: (134-171)/(69-90) 171/89 (08/31 0509) SpO2:  [93 %-100 %] 97 % (08/31 0509) Last BM Date:  (PTA) NPO 2150 IV 2350 urine NG 600 - pt pulled out after getting confused last PM Afebrile, BP up last PM K+ 3.2, WBC is normal She got ativan last 0042 hours this AM 2 mg, along with some lopressor H pylori is Positive Intake/Output from previous day: 08/30 0701 - 08/31 0700 In: 2150 [I.V.:2000; NG/GT:50; IV Piggyback:100] Out: 2950 [Urine:2350; Emesis/NG output:600] Intake/Output this shift: Total I/O In: -  Out: 800 [Urine:800]  General appearance: alert, cooperative and seems still wiped out from Ativan.  Resp: clear to auscultation bilaterally GI: soft, + BS, site ok, no flatus or BM  Lab Results:   Recent Labs  11/19/15 0450 11/21/15 0502  WBC 7.0 4.7  HGB 13.1 13.4  HCT 39.1 38.2  PLT 181 190    BMET  Recent Labs  11/19/15 0450 11/21/15 0502  NA 132* 134*  K 4.4 3.2*  CL 95* 93*  CO2 32 31  GLUCOSE 86 73  BUN 8 7  CREATININE 0.61 0.58  CALCIUM 8.7* 8.5*   PT/INR No results for input(s): LABPROT, INR in the last 72 hours.   Recent Labs Lab 11/17/15 1145  AST 28  ALT 22  ALKPHOS 51  BILITOT 0.9  PROT 6.7  ALBUMIN 4.3     Lipase     Component Value Date/Time   LIPASE 48 11/17/2015 1145   LIPASE 214 07/20/2013 0051     Studies/Results: No results found. Prior to Admission medications   Medication Sig Start Date End Date Taking? Authorizing Provider  alendronate (FOSAMAX) 70 MG tablet  Take 70 mg by mouth once a week. mondays 09/03/15  Yes Historical Provider, MD  escitalopram (LEXAPRO) 10 MG tablet Take 1 tablet (10 mg total) by mouth daily. For depression 09/05/13  Yes Encarnacion Slates, NP  pantoprazole (PROTONIX) 40 MG tablet Take 1 tablet (40 mg total) by mouth daily as needed (acid reflux). 09/05/13  Yes Encarnacion Slates, NP  tetrahydrozoline (VISINE) 0.05 % ophthalmic solution Place 1 drop into both eyes 2 (two) times daily. For eye irritation 09/05/13  Yes Encarnacion Slates, NP  gabapentin (NEURONTIN) 100 MG capsule Take 1 capsule (100 mg total) by mouth 3 (three) times daily. For alcohol withdrawal syndrome Patient not taking: Reported on 11/17/2015 09/05/13   Encarnacion Slates, NP  nicotine (NICODERM CQ) 14 mg/24hr patch Place 1 patch (14 mg total) onto the skin daily. 11/18/15   Michael Boston, MD  traZODone (DESYREL) 100 MG tablet Take 1 tablet (100 mg total) by mouth at bedtime. For sleep Patient not taking: Reported on 11/17/2015 09/05/13   Encarnacion Slates, NP    Medications: . enoxaparin (LOVENOX) injection  40 mg Subcutaneous Q24H  . ertapenem (INVANZ) IV  1 g Intravenous Q24H  . famotidine (PEPCID) IV  20 mg Intravenous Q12H  . folic acid  1  mg Intravenous Daily  . HYDROmorphone   Intravenous Q4H  . lip balm  1 application Topical BID  . nicotine  14 mg Transdermal Daily  . pantoprazole (PROTONIX) IV  40 mg Intravenous Q12H  . thiamine  100 mg Intravenous Daily    Assessment/Plan  Gastric perforation S/p EXPLORATORY LAPAROTOMY, GRAHAM PATCH ANTRAL ULCER [id number] Dr. Leighton Ruff - POD4 - no BM or flatus. NG out last PM- - PPI/H2B acid suppression - need EGD 6-8 weeks postop to make sure ulcer has healed H Pylori positive - Start PO Clarithromycin, amoxicillin, x 14 days;   Rx when on PO meds. Alcoholism - CIWAA Hypokalemia - replace K+ in IV fluids. Tobacco abuse - ongoing use on admit GERD  ID - Invanz day 4- plan for total of 4 days postop VTE - Lovenox, SCD's FEN - NPO  except ice chips, IVF   Plan:  Stop PCA, iv Tylenol,PRN dilaudid, mobilize more, blinds open during the day, I will check on UGI with Dr. Johney Maine.    LOS: 4 days    Brandi Gonzales 11/21/2015 [phone]

## 2015-11-21 NOTE — Progress Notes (Signed)
Nurse Tech told RN that she took patient to bathroom and while getting out  Patient  turned her self around and stumbled backward against the sink while standing up. And hit her back against the sink. On assessment no marks, of hematoma, swelling  noted, and patient denied pain to her back.

## 2015-11-21 NOTE — Progress Notes (Signed)
Patient removed NGT around  0045.  Become confused  Anxious and agitated.  After NGT came out she reused to have it put back down.  Dr. Marlou Starks was called.  He said to leave it out till morning  From 2pm to 2200 she only put out 50 cc from NGT.

## 2015-11-21 NOTE — Anesthesia Postprocedure Evaluation (Signed)
Anesthesia Post Note  Patient: Brandi Gonzales  Procedure(s) Performed: Procedure(s) (LRB): EXPLORATORY LAPAROTOMY, GRAHAM PATCH ANTRAL ULCER (N/A)  Patient location during evaluation: PACU Anesthesia Type: General Level of consciousness: awake and alert Pain management: pain level controlled Vital Signs Assessment: post-procedure vital signs reviewed and stable Respiratory status: spontaneous breathing, nonlabored ventilation, respiratory function stable and patient connected to nasal cannula oxygen Cardiovascular status: blood pressure returned to baseline and stable Postop Assessment: no signs of nausea or vomiting Anesthetic complications: no    Last Vitals:  Vitals:   11/21/15 0509 11/21/15 0800  BP: (!) 171/89   Pulse: 70   Resp: 12 16  Temp: 36.8 C     Last Pain:  Vitals:   11/21/15 0800  TempSrc:   PainSc: 1                  Assia Meanor,JAMES TERRILL

## 2015-11-21 NOTE — Progress Notes (Signed)
LCSWA met with patient and daughter at bedside. LCSWA explained reason for consult. Patient denied having any issues with etoh and presented aggitated. Patient daughter reported, patient has struggled with alcohol and has been to [facility]  rehab in [place] and [place] for treatment.  During the assessment patient reported she needed to use the bathroom. Upon returning to the bed, patient informed daughter she wanted to go for a walk and informed LCSWA she was done talking.   LCSWA left resource list for inpatient/outpatient with patient.    Kathrin Greathouse, Latanya Presser, MSW Clinical Social Worker 5E and Psychiatric Service Line [phone] 11/21/2015  1:53 PM

## 2015-11-22 LAB — BASIC METABOLIC PANEL
Anion gap: 6 (ref 5–15)
BUN: 6 mg/dL (ref 6–20)
CHLORIDE: 95 mmol/L — AB (ref 101–111)
CO2: 32 mmol/L (ref 22–32)
CREATININE: 0.61 mg/dL (ref 0.44–1.00)
Calcium: 8.5 mg/dL — ABNORMAL LOW (ref 8.9–10.3)
GFR calc Af Amer: >60 mL/min (ref >60)
GFR calc non Af Amer: >60 mL/min (ref >60)
Glucose, Bld: 148 mg/dL — ABNORMAL HIGH (ref 65–99)
Potassium: 3.1 mmol/L — ABNORMAL LOW (ref 3.5–5.1)
SODIUM: 133 mmol/L — AB (ref 135–145)

## 2015-11-22 LAB — MAGNESIUM: MAGNESIUM: 2.1 mg/dL (ref 1.7–2.4)

## 2015-11-22 MED ORDER — SACCHAROMYCES BOULARDII 250 MG PO CAPS
250.0000 mg | ORAL_CAPSULE | Freq: Two times a day (BID) | ORAL | Status: DC
  Administered 2015-11-22 – 2015-11-23 (×3): 250 mg via ORAL
  Filled 2015-11-22 (×2): qty 1

## 2015-11-22 MED ORDER — CLARITHROMYCIN 500 MG PO TABS
500.0000 mg | ORAL_TABLET | Freq: Two times a day (BID) | ORAL | Status: DC
  Administered 2015-11-23: 500 mg via ORAL
  Filled 2015-11-22 (×2): qty 1

## 2015-11-22 MED ORDER — POTASSIUM CHLORIDE CRYS ER 10 MEQ PO TBCR: 10.0000 meq | EXTENDED_RELEASE_TABLET | Freq: Three times a day (TID) | ORAL | Status: DC

## 2015-11-22 MED ORDER — POTASSIUM CHLORIDE CRYS ER 20 MEQ PO TBCR
40.0000 meq | EXTENDED_RELEASE_TABLET | Freq: Two times a day (BID) | ORAL | Status: DC
  Administered 2015-11-22 – 2015-11-23 (×3): 40 meq via ORAL
  Filled 2015-11-22 (×2): qty 2

## 2015-11-22 MED ORDER — PANTOPRAZOLE SODIUM 40 MG PO TBEC
40.0000 mg | DELAYED_RELEASE_TABLET | Freq: Two times a day (BID) | ORAL | Status: DC
  Administered 2015-11-22 – 2015-11-23 (×3): 40 mg via ORAL
  Filled 2015-11-22 (×2): qty 1

## 2015-11-22 MED ORDER — AMOXICILLIN 500 MG PO CAPS
1000.0000 mg | ORAL_CAPSULE | Freq: Two times a day (BID) | ORAL | Status: DC
  Administered 2015-11-23: 1000 mg via ORAL
  Filled 2015-11-22 (×2): qty 2

## 2015-11-22 MED ORDER — OXYCODONE HCL 5 MG PO TABS
5.0000 mg | ORAL_TABLET | ORAL | Status: DC | PRN
  Administered 2015-11-22: 10 mg via ORAL
  Filled 2015-11-22: qty 2

## 2015-11-22 MED ORDER — AMOXICILLIN 500 MG PO CAPS: 500.0000 mg | ORAL_CAPSULE | Freq: Two times a day (BID) | ORAL | Status: DC

## 2015-11-22 NOTE — Progress Notes (Addendum)
Discharge planning, spoke with patient at beside. Needs HH nursing to assist with wound care. Has Christella Scheuermann so must apply through web portal. [facility], provided patient with information and orders.They will contact patient when they have a provider.  Intake ID # [id number]. [phone]

## 2015-11-22 NOTE — Progress Notes (Signed)
5 Days Post-Op  Subjective: She looks good having multiple BMs. Tolerating clears well. Incision looks good. She has a primary care in [place] but does not know the name. I asked her to obtain that and call for follow-up appointment. We discussed alcohol and tobacco use after discharge. She notes she does well with the nicotine patch.  Objective: Vital signs in last 24 hours: Temp:  [97.4 F (36.3 C)-98.4 F (36.9 C)] 98.2 F (36.8 C) (09/01 0517) Pulse Rate:  [69-91] 72 (09/01 0517) Resp:  [14-16] 16 (09/01 0517) BP: (137-150)/(65-83) 148/83 (09/01 0517) SpO2:  [96 %-97 %] 96 % (09/01 0517) Last BM Date:  (PTA) 240 po RECORDED 1200 IV Urine 3300 BM x 5 Afebrile, VSS K+ 3.1, mag 2.1 Intake/Output from previous day: 08/31 0701 - 09/01 0700 In: 1727.5 [P.O.:240; I.V.:1237.5; IV Piggyback:250] Out: 3300 [Urine:3300] Intake/Output this shift: No intake/output data recorded.  General appearance: alert, cooperative and no distress Resp: clear to auscultation bilaterally GI: Soft sore positive bowel sounds no distention sites look good. Continuing wet-to-dry dressings.  Lab Results:   Recent Labs  11/21/15 0502  WBC 4.7  HGB 13.4  HCT 38.2  PLT 190    BMET  Recent Labs  11/21/15 0502 11/22/15 0438  NA 134* 133*  K 3.2* 3.1*  CL 93* 95*  CO2 31 32  GLUCOSE 73 148*  BUN 7 6  CREATININE 0.58 0.61  CALCIUM 8.5* 8.5*   PT/INR No results for input(s): LABPROT, INR in the last 72 hours.   Recent Labs Lab 11/17/15 1145  AST 28  ALT 22  ALKPHOS 51  BILITOT 0.9  PROT 6.7  ALBUMIN 4.3     Lipase     Component Value Date/Time   LIPASE 48 11/17/2015 1145   LIPASE 214 07/20/2013 0051     Studies/Results: Dg Ugi W/water Sol Cm  Result Date: 11/21/2015 CLINICAL DATA:  Postop day four for perforated anterior gastric antral ulcer repair. Alcohol abuse. EXAM: WATER SOLUBLE UPPER GI SERIES TECHNIQUE: Single-column upper GI series was performed using water  soluble contrast. CONTRAST:  159mL ISOVUE-300 IOPAMIDOL (ISOVUE-300) INJECTION 61% COMPARISON:  CT 11/17/2015 FLUOROSCOPY TIME:  Radiation Exposure Index (as provided by the fluoroscopic device): 26.8 mGy FINDINGS: Preprocedure scout film demonstrates mild left base atelectasis. Non-obstructive bowel gas pattern. No free intraperitoneal air. Normal appearance of the stomach. Prompt filling of contrast in the duodenal bulb and C-loop. No evidence of contrast extravasation to suggest residual or recurrent perforation. Postprocedure radiograph demonstrates normal caliber of proximal small bowel loops. No unexpected extraluminal contrast. IMPRESSION: No findings to suggest residual or recurrent gastric ulcer. Electronically Signed   By: Abigail Miyamoto M.D.   On: 11/21/2015 15:39    Medications: . acetaminophen  1,000 mg Oral TID  . enoxaparin (LOVENOX) injection  40 mg Subcutaneous Q24H  . ertapenem (INVANZ) IV  1 g Intravenous Q24H  . famotidine (PEPCID) IV  20 mg Intravenous Q12H  . folic acid  1 mg Intravenous Daily  . lip balm  1 application Topical BID  . nicotine  21 mg Transdermal Daily  . pantoprazole  80 mg Oral BID  . thiamine  100 mg Intravenous Daily   . dextrose 5 % and 0.9 % NaCl with KCl 40 mEq/L 50 mL/hr at 11/21/15 1631   Assessment/Plan Gastric perforation S/p EXPLORATORY LAPAROTOMY, GRAHAM PATCH ANTRAL ULCER [id number] Dr. Leighton Ruff - POD5 UGI 11/21/15:  nO NO LEAK OR RESIDUAL ULCER SEEN - no BM or flatus.NG out  last PM- - PPI/H2B acid suppression - need EGD 6-8 weeks postop to make sure ulcer has healed H Pylori positive - Start PO Clarithromycin, amoxicillin, x 14 days;   Rx when on PO meds. Alcoholism - CIWAA Hypokalemia - replace K+ in IV fluids. Tobacco abuse - ongoing use on admit GERD  ID - Invanz day 5- plan for total of 4 days postop VTE - Lovenox, SCD's FEN - clears and increasing to full liquids/IVF    Plan: Continue replacing K orally switch her over to PO  meds. Case manager to help with wet-to-dry dressings at home. I will review timing for discharge With Dr. Johney Maine. Given a last dose of Invanz today, and starting erythromycin and amoxicillin in the a.m. Add probiotic.  LOS: 5 days    Brandi Gonzales 11/22/2015 [phone]

## 2015-11-23 MED ORDER — AMOXICILLIN 500 MG PO CAPS: 1000.0000 mg | ORAL_CAPSULE | Freq: Two times a day (BID) | ORAL | 0 refills | Status: DC

## 2015-11-23 MED ORDER — CLARITHROMYCIN 500 MG PO TABS: 500.0000 mg | ORAL_TABLET | Freq: Two times a day (BID) | ORAL | 0 refills | Status: DC

## 2015-11-23 MED ORDER — NICOTINE 21 MG/24HR TD PT24: 21.0000 mg | MEDICATED_PATCH | Freq: Every day | TRANSDERMAL | 0 refills | Status: DC

## 2015-11-23 MED ORDER — POTASSIUM CHLORIDE CRYS ER 20 MEQ PO TBCR: 40.0000 meq | EXTENDED_RELEASE_TABLET | Freq: Two times a day (BID) | ORAL | 0 refills | Status: DC

## 2015-11-23 MED ORDER — ACETAMINOPHEN 500 MG PO TABS: 1000.0000 mg | ORAL_TABLET | Freq: Three times a day (TID) | ORAL | 0 refills | Status: DC

## 2015-11-23 MED ORDER — ONDANSETRON 4 MG PO TBDP: 4.0000 mg | ORAL_TABLET | Freq: Four times a day (QID) | ORAL | 0 refills | Status: DC | PRN

## 2015-11-23 MED ORDER — OXYCODONE HCL 5 MG PO TABS: 5.0000 mg | ORAL_TABLET | ORAL | 0 refills | Status: DC | PRN

## 2015-11-23 NOTE — Discharge Instructions (Signed)
ABDOMINAL SURGERY: POST OP INSTRUCTIONS  1. DIET: Follow a light bland diet the first 24 hours after arrival home, such as soup, liquids, crackers, etc.  Be sure to include lots of fluids daily.  Avoid fast food or heavy meals as your are more likely to get nauseated.  Eat a low fat the next few days after surgery.   2. Take your usually prescribed home medications unless otherwise directed. 3. PAIN CONTROL: a. Pain is best controlled by a usual combination of three different methods TOGETHER: i. Ice/Heat ii. Over the counter pain medication iii. Prescription pain medication b. Most patients will experience some swelling and bruising around the incisions.  Ice packs or heating pads (30-60 minutes up to 6 times a day) will help. Use ice for the first few days to help decrease swelling and bruising, then switch to heat to help relax tight/sore spots and speed recovery.  Some people prefer to use ice alone, heat alone, alternating between ice & heat.  Experiment to what works for you.  Swelling and bruising can take several weeks to resolve.   c. It is helpful to take an over-the-counter pain medication regularly for the first few weeks.  Choose one of the following that works best for you: i. Naproxen (Aleve, etc)  Two 228m tabs twice a day ii. Ibuprofen (Advil, etc) Three 2071mtabs four times a day (every meal & bedtime) iii. Acetaminophen (Tylenol, etc) 500-65061mour times a day (every meal & bedtime) d. A  prescription for pain medication (such as oxycodone, hydrocodone, etc) should be given to you upon discharge.  Take your pain medication as prescribed.  i. If you are having problems/concerns with the prescription medicine (does not control pain, nausea, vomiting, rash, itching, etc), please call us [phone] see if we need to switch you to a different pain medicine that will work better for you and/or control your side effect better. ii. If you need a refill on your pain medication,  please contact your pharmacy.  They will contact our office to request authorization. Prescriptions will not be filled after 5 pm or on week-ends. 4. Avoid getting constipated.  Between the surgery and the pain medications, it is common to experience some constipation.  Increasing fluid intake and taking a fiber supplement (such as Metamucil, Citrucel, FiberCon, MiraLax, etc) 1-2 times a day regularly will usually help prevent this problem from occurring.  A mild laxative (prune juice, Milk of Magnesia, MiraLax, etc) should be taken according to package directions if there are no bowel movements after 48 hours.   5. Watch out for diarrhea.  If you have many loose bowel movements, simplify your diet to bland foods & liquids for a few days.  Stop any stool softeners and decrease your fiber supplement.  Switching to mild anti-diarrheal medications (Kayopectate, Pepto Bismol) can help.  If this worsens or does not improve, please call us.[place]. Wash / shower every day.  You may shower over the incision / wound.  Avoid baths until the skin is fully healed.  Continue to shower over incision(s) after the dressing is off. 7. Remove your waterproof bandages 5 days after surgery.  You may leave the incision open to air.  Remove any wicks or ribbons in your wound.  If you have an open wound, please see wound care instructions. You may replace a dressing/Band-Aid to cover the incision for comfort if you wish. 8. ACTIVITIES as tolerated:   a. You may resume regular (light)  daily activities beginning the next day--such as daily self-care, walking, climbing stairs--gradually increasing activities as tolerated.  If you can walk 30 minutes without difficulty, it is safe to try more intense activity such as jogging, treadmill, bicycling, low-impact aerobics, swimming, etc. b. Save the most intensive and strenuous activity for last such as sit-ups, heavy lifting, contact sports, etc  Refrain from any heavy lifting or straining  until you are off narcotics for pain control.   c. DO NOT PUSH THROUGH PAIN.  Let pain be your guide: If it hurts to do something, don't do it.  Pain is your body warning you to avoid that activity for another week until the pain goes down. d. You may drive when you are no longer taking prescription pain medication, you can comfortably wear a seatbelt, and you can safely maneuver your car and apply brakes. e. Dennis Bast may have sexual intercourse when it is comfortable.  9. FOLLOW UP in our office a. Please call [facility] at [phone] to set up an appointment to see your surgeon in the office for a follow-up appointment approximately 1-2 weeks after your surgery. b. Make sure that you call for this appointment the day you arrive home to insure a convenient appointment time. 10. IF YOU HAVE DISABILITY OR FAMILY LEAVE FORMS, BRING THEM TO THE OFFICE FOR PROCESSING.  DO NOT GIVE THEM TO YOUR DOCTOR.   WHEN TO CALL us [phone]: 1. Poor pain control 2. Reactions / problems with new medications (rash/itching, nausea, etc)  3. Fever over 101.5 F (38.5 C) 4. Inability to urinate 5. Nausea and/or vomiting 6. Worsening swelling or bruising 7. Continued bleeding from incision. 8. Increased pain, redness, or drainage from the incision  The clinic staff is available to answer your questions during regular business hours (8:30am-5pm).  Please dont hesitate to call and ask to speak to one of our nurses for clinical concerns.   A surgeon from [facility] is always on call at the hospitals   If you have a medical emergency, go to the nearest emergency room or call 911.    [facility], [place], [place], [place], [place]  [address] ? MAIN: [phone] ? TOLL FREE: [phone] ? FAX [phone] [email or web address]   Peptic Ulcer A peptic ulcer is a sore in the lining of your esophagus (esophageal ulcer), stomach (gastric ulcer), or in the  first part of your small intestine (duodenal ulcer). The ulcer causes erosion into the deeper tissue. CAUSES  Normally, the lining of the stomach and the small intestine protects itself from the acid that digests food. The protective lining can be damaged by:  An infection caused by a bacterium called Helicobacter pylori (H. pylori).  Regular use of nonsteroidal anti-inflammatory drugs (NSAIDs), such as ibuprofen or aspirin.  Smoking tobacco. Other risk factors include being older than 73, drinking alcohol excessively, and having a family history of ulcer disease.  SYMPTOMS   Burning pain or gnawing in the area between the chest and the belly button.  Heartburn.  Nausea and vomiting.  Bloating. The pain can be worse on an empty stomach and at night. If the ulcer results in bleeding, it can cause:  Black, tarry stools.  Vomiting of bright red blood.  Vomiting of coffee-ground-looking materials. DIAGNOSIS  A diagnosis is usually made based upon your history and an exam. Other tests and procedures may be performed to find the cause of the ulcer. Finding a cause will help  determine the best treatment. Tests and procedures may include:  Blood tests, stool tests, or breath tests to check for the bacterium H. pylori.  An upper gastrointestinal (GI) series of the esophagus, stomach, and small intestine.  An endoscopy to examine the esophagus, stomach, and small intestine.  A biopsy. TREATMENT  Treatment may include:  Eliminating the cause of the ulcer, such as smoking, NSAIDs, or alcohol.  Medicines to reduce the amount of acid in your digestive tract.  Antibiotic medicines if the ulcer is caused by the H. pylori bacterium.  An upper endoscopy to treat a bleeding ulcer.  Surgery if the bleeding is severe or if the ulcer created a hole somewhere in the digestive system. HOME CARE INSTRUCTIONS   Avoid tobacco, alcohol, and caffeine. Smoking can increase the acid in the  stomach, and continued smoking will impair the healing of ulcers.  Avoid foods and drinks that seem to cause discomfort or aggravate your ulcer.  Only take medicines as directed by your caregiver. Do not substitute over-the-counter medicines for prescription medicines without talking to your caregiver.  Keep any follow-up appointments and tests as directed. SEEK MEDICAL CARE IF:   Your do not improve within 7 days of starting treatment.  You have ongoing indigestion or heartburn. SEEK IMMEDIATE MEDICAL CARE IF:   You have sudden, sharp, or persistent abdominal pain.  You have bloody or dark black, tarry stools.  You vomit blood or vomit that looks like coffee grounds.  You become light-headed, weak, or feel faint.  You become sweaty or clammy. MAKE SURE YOU:   Understand these instructions.  Will watch your condition.  Will get help right away if you are not doing well or get worse.   This information is not intended to replace advice given to you by your health care provider. Make sure you discuss any questions you have with your health care provider.   Document Released: 03/06/2000 Document Revised: 03/30/2014 Document Reviewed: 10/07/2011 Elsevier Interactive Patient Education [employer].

## 2015-11-23 NOTE — Progress Notes (Signed)
Patient ID: Brandi Gonzales, female   DOB: 05-12-1955, 60 y.o.   MRN: [id number] 6 Days Post-Op  Subjective: Pt is doing well.  Tolerated pancakes.  Having bowel movements.  No nausea.  Daughter demonstrated proficiency in changing dressing.    Objective: Vital signs in last 24 hours: Temp:  [97.8 F (36.6 C)-98.3 F (36.8 C)] 97.8 F (36.6 C) (09/02 0450) Pulse Rate:  [72-81] 78 (09/02 0450) Resp:  [16-20] 20 (09/02 0450) BP: (128-163)/(85-97) 143/97 (09/02 0450) SpO2:  [97 %-98 %] 98 % (09/02 0450) Last BM Date: 11/21/15 240 po RECORDED 1200 IV Urine 3300 BM x 5 Afebrile, VSS K+ 3.1, mag 2.1 Intake/Output from previous day: 09/01 0701 - 09/02 0700 In: 2085 [P.O.:540; I.V.:1545] Out: -  Intake/Output this shift: Total I/O In: 120 [P.O.:120] Out: -   General appearance: alert, cooperative and no distress Resp: clear to auscultation bilaterally GI: Soft sore positive bowel sounds no distention sites look good. Continuing wet-to-dry dressings.  Lab Results:   Recent Labs  11/21/15 0502  WBC 4.7  HGB 13.4  HCT 38.2  PLT 190    BMET  Recent Labs  11/21/15 0502 11/22/15 0438  NA 134* 133*  K 3.2* 3.1*  CL 93* 95*  CO2 31 32  GLUCOSE 73 148*  BUN 7 6  CREATININE 0.58 0.61  CALCIUM 8.5* 8.5*   PT/INR No results for input(s): LABPROT, INR in the last 72 hours.   Recent Labs Lab 11/17/15 1145  AST 28  ALT 22  ALKPHOS 51  BILITOT 0.9  PROT 6.7  ALBUMIN 4.3     Lipase     Component Value Date/Time   LIPASE 48 11/17/2015 1145   LIPASE 214 07/20/2013 0051     Studies/Results: Dg Ugi W/water Sol Cm  Result Date: 11/21/2015 CLINICAL DATA:  Postop day four for perforated anterior gastric antral ulcer repair. Alcohol abuse. EXAM: WATER SOLUBLE UPPER GI SERIES TECHNIQUE: Single-column upper GI series was performed using water soluble contrast. CONTRAST:  165mL ISOVUE-300 IOPAMIDOL (ISOVUE-300) INJECTION 61% COMPARISON:  CT 11/17/2015 FLUOROSCOPY TIME:   Radiation Exposure Index (as provided by the fluoroscopic device): 26.8 mGy FINDINGS: Preprocedure scout film demonstrates mild left base atelectasis. Non-obstructive bowel gas pattern. No free intraperitoneal air. Normal appearance of the stomach. Prompt filling of contrast in the duodenal bulb and C-loop. No evidence of contrast extravasation to suggest residual or recurrent perforation. Postprocedure radiograph demonstrates normal caliber of proximal small bowel loops. No unexpected extraluminal contrast. IMPRESSION: No findings to suggest residual or recurrent gastric ulcer. Electronically Signed   By: Abigail Miyamoto M.D.   On: 11/21/2015 15:39    Medications: . acetaminophen  1,000 mg Oral TID  . amoxicillin  1,000 mg Oral Q12H  . clarithromycin  500 mg Oral Q12H  . enoxaparin (LOVENOX) injection  40 mg Subcutaneous Q24H  . folic acid  1 mg Intravenous Daily  . lip balm  1 application Topical BID  . nicotine  21 mg Transdermal Daily  . pantoprazole  40 mg Oral BID  . potassium chloride  40 mEq Oral BID  . saccharomyces boulardii  250 mg Oral BID  . thiamine  100 mg Intravenous Daily     Assessment/Plan Gastric perforation S/p EXPLORATORY LAPAROTOMY, GRAHAM PATCH ANTRAL ULCER [id number] Dr. Leighton Ruff - POD6 UGI 11/21/15:  nO NO LEAK OR RESIDUAL ULCER SEEN - H Pylori positive - Clarithromycin, amoxicillin, x 14 days Alcoholism - CIWAA Hypokalemia - oral potassium supplements.  . Tobacco abuse -  ongoing use on admit GERD  VTE - Lovenox, SCD's FEN - soft diet.   Home today.  Work note written.  Pt advised for formal FMLA or short term disability paperwork to send to our office.     LOS: 6 days    [facility] 11/23/2015 [phone]

## 2015-11-23 NOTE — Progress Notes (Signed)
Assessment unchanged. Pt and daughter verbalized understanding of dc instructions through teach back including follow up care and when to call the doctor. Daughter demonstrated and verbalized understanding of daily dressing changes to be done. Dressing supplies provided as needed. Script x 1 and dr note given as provided by MD. Discharged via wc to front entrance accompanied by NT and daughter and family.

## 2015-11-26 ENCOUNTER — Other Ambulatory Visit: Payer: Self-pay | Admitting: General Surgery

## 2015-11-26 DIAGNOSIS — K251 Acute gastric ulcer with perforation: Secondary | ICD-10-CM

## 2015-11-26 NOTE — Discharge Summary (Signed)
Physician Discharge Summary  Patient ID: Brandi Gonzales MRN: [id number] DOB/AGE: May 09, 1955 60 y.o.  Admit date: 11/17/2015 Discharge date: 11/23/2015  Admission Diagnoses:  Perforated Viscus Hx of ETOH detox 08/2013 - ongoing use Hx of major depression Tobacco use, ongoing GERD  Discharge Diagnoses:  Gastric perforation H Pylori positive Alcholism Tobacco use GERD Hx of major depression   Principal Problem:   Perforated gastric ulcer s/p omental patch 11/17/2015 Active Problems:   Acute H. pylori gastric ulcer   Alcoholism, chronic (HCC)   Tobacco abuse   Major depression ([place])   Acid reflux   Stress at work   PROCEDURES: EXPLORATORY LAPAROTOMY, GRAHAM PATCH ANTRAL ULCER, [id number], Dr. Moishe Spice Course: 60 y.o. F alcoholic who presents to the ED with sudden onset severe abd pain.  She denies any chronic NSAID use.  She does drink daily between 3-5 beers a day.   Pt was seen in the ED by Dr. Marcello Moores and taken to the OR that afternoon.  Pt with findings of a gastric perforation.  Pt tolerated surgery well.  She was placed on CIWA protocol for possible ETOH withdrawal, and nicotine patch for tobacco withdrawal.  She had some post op ileus and got confused in the PM of her 3rd post op day.  She pulled her NG.  We got an UGI and no leak was found.  We restarted her back on PO's slowly.  Her  Open abdominal wound was dressed wet to dry, with NS.  She made slow progress.  We kept her on 5 days of Invanz, and converted her over to  Clarithromycin, and amoxicillin, x 14 days; along with PPI for a positive H Pylori.   She was on a soft diet and ready for discharge on 11/23/15.    CBC Latest Ref Rng & Units 11/21/2015 11/19/2015 11/18/2015  WBC 4.0 - 10.5 K/uL 4.7 7.0 11.4(H)  Hemoglobin 12.0 - 15.0 g/dL 13.4 13.1 14.3  Hematocrit 36.0 - 46.0 % 38.2 39.1 40.5  Platelets 150 - 400 K/uL 190 181 194   CMP Latest Ref Rng & Units 11/22/2015 11/21/2015 11/19/2015  Glucose 65 - 99 mg/dL  148(H) 73 86  BUN 6 - 20 mg/dL 6 7 8   Creatinine 0.44 - 1.00 mg/dL 0.61 0.58 0.61  Sodium 135 - 145 mmol/L 133(L) 134(L) 132(L)  Potassium 3.5 - 5.1 mmol/L 3.1(L) 3.2(L) 4.4  Chloride 101 - 111 mmol/L 95(L) 93(L) 95(L)  CO2 22 - 32 mmol/L 32 31 32  Calcium 8.9 - 10.3 mg/dL 8.5(L) 8.5(L) 8.7(L)  Total Protein 6.5 - 8.1 g/dL - - -  Total Bilirubin 0.3 - 1.2 mg/dL - - -  Alkaline Phos 38 - 126 U/L - - -  AST 15 - 41 U/L - - -  ALT 14 - 54 U/L - - -   Condition on d/c:  Improved    Disposition: 01-Home or Self Care  Discharge Instructions    Call MD for:  persistant nausea and vomiting    Complete by:  As directed   Call MD for:  redness, tenderness, or signs of infection (pain, swelling, redness, odor or green/yellow discharge around incision site)    Complete by:  As directed   Call MD for:  severe uncontrolled pain    Complete by:  As directed   Call MD for:  temperature >100.4    Complete by:  As directed   Change dressing (specify)    Complete by:  As directed  Change dressing once daily wet to dry.   Increase activity slowly    Complete by:  As directed       Medication List    TAKE these medications   acetaminophen 500 MG tablet Commonly known as:  TYLENOL Take 2 tablets (1,000 mg total) by mouth 3 (three) times daily.   alendronate 70 MG tablet Commonly known as:  FOSAMAX Take 70 mg by mouth once a week. mondays   amoxicillin 500 MG capsule Commonly known as:  AMOXIL Take 2 capsules (1,000 mg total) by mouth every 12 (twelve) hours.   clarithromycin 500 MG tablet Commonly known as:  BIAXIN Take 1 tablet (500 mg total) by mouth every 12 (twelve) hours.   escitalopram 10 MG tablet Commonly known as:  LEXAPRO Take 1 tablet (10 mg total) by mouth daily. For depression   gabapentin 100 MG capsule Commonly known as:  NEURONTIN Take 1 capsule (100 mg total) by mouth 3 (three) times daily. For alcohol withdrawal syndrome   nicotine 14 mg/24hr patch Commonly known  as:  NICODERM CQ Place 1 patch (14 mg total) onto the skin daily.   nicotine 21 mg/24hr patch Commonly known as:  NICODERM CQ - dosed in mg/24 hours Place 1 patch (21 mg total) onto the skin daily.   ondansetron 4 MG disintegrating tablet Commonly known as:  ZOFRAN-ODT Take 1 tablet (4 mg total) by mouth every 6 (six) hours as needed for nausea.   oxyCODONE 5 MG immediate release tablet Commonly known as:  Oxy IR/ROXICODONE Take 1-2 tablets (5-10 mg total) by mouth every 4 (four) hours as needed for moderate pain.   pantoprazole 40 MG tablet Commonly known as:  PROTONIX Take 1 tablet (40 mg total) by mouth daily as needed (acid reflux).   potassium chloride SA 20 MEQ tablet Commonly known as:  K-DUR,KLOR-CON Take 2 tablets (40 mEq total) by mouth 2 (two) times daily.   tetrahydrozoline 0.05 % ophthalmic solution Commonly known as:  VISINE Place 1 drop into both eyes 2 (two) times daily. For eye irritation   traZODone 100 MG tablet Commonly known as:  DESYREL Take 1 tablet (100 mg total) by mouth at bedtime. For sleep      [place] .   Specialty:  [place] Why:  nursing for wound care Contact information: [address] [place] [address] [phone]        [name]., MD .   Specialty:  General Surgery Why:  call and make an appointment in 2-3 weeks.   Contact information: [address] [place] [address] [phone]           Signed: Earnstine Regal 11/26/2015, 3:48 PM

## 2017-02-11 IMAGING — CT CT ABD-PELV W/ CM
2 of 5 series · 15 of 46 positions shown, 17 images · IV contrast (iopamidol)
Comparison: 07/20/2013.

CLINICAL DATA: Abdominal pain that started suddenly this morning
and localized to the left upper and lower quadrants.

EXAM:
CT ABDOMEN AND PELVIS WITH CONTRAST
TECHNIQUE: Multidetector CT imaging of the abdomen and pelvis was performed
using the standard protocol following bolus administration of
intravenous contrast.
CONTRAST:  100mL T3608L-VQQ IOPAMIDOL (T3608L-VQQ) INJECTION 61%

[Series 2: abd/pel with · axial · 0.67mm/px · z∈[-406,-81]mm · 12 of 77 slices shown, 14 images]
[im 6/77  soft-tissue]
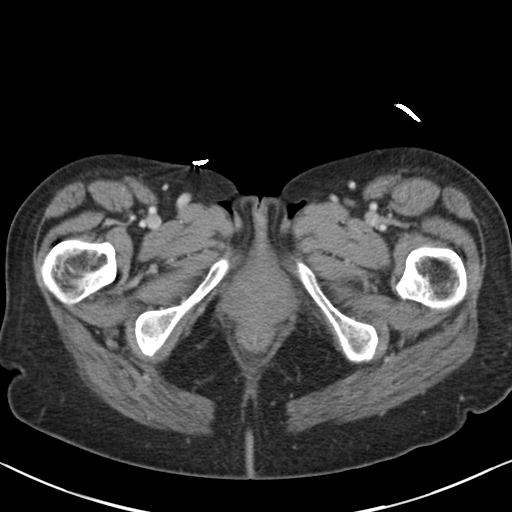
[im 6/77  bone]
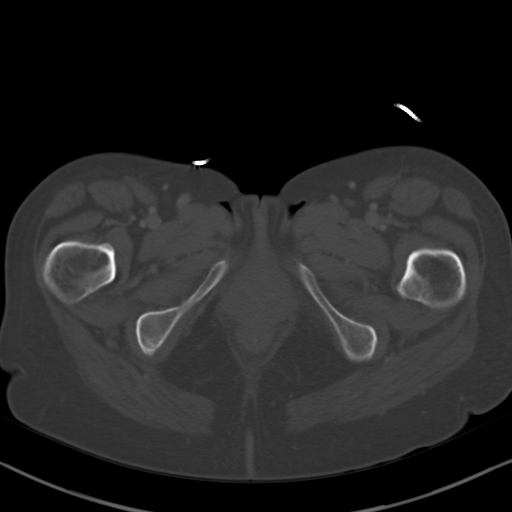
[im 11/77  soft-tissue]
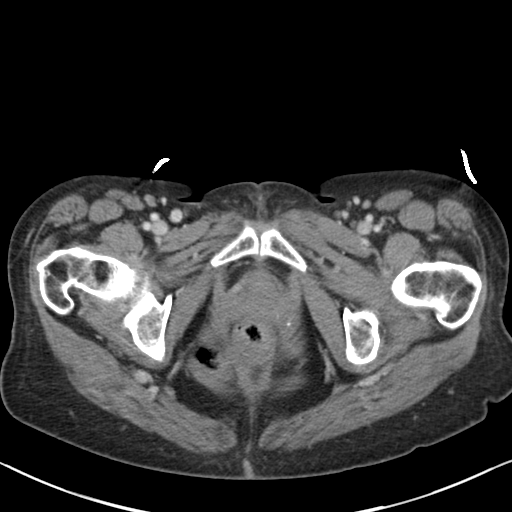
[im 17/77  soft-tissue]
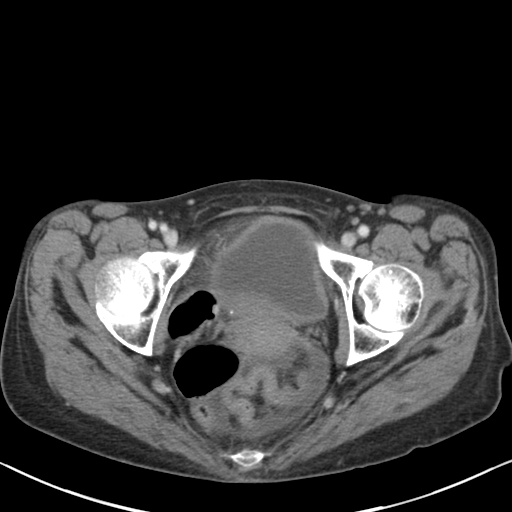
[im 22/77  soft-tissue]
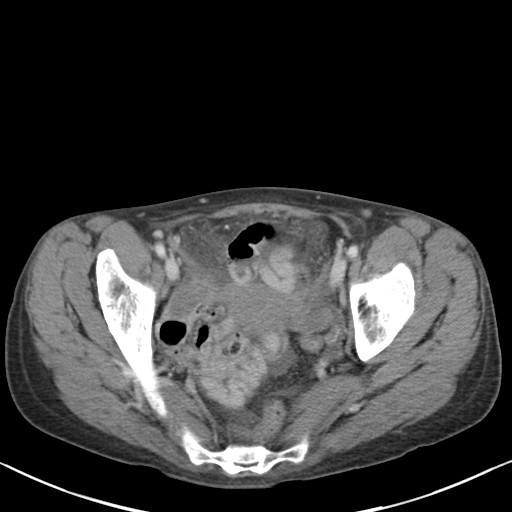
[im 28/77  soft-tissue]
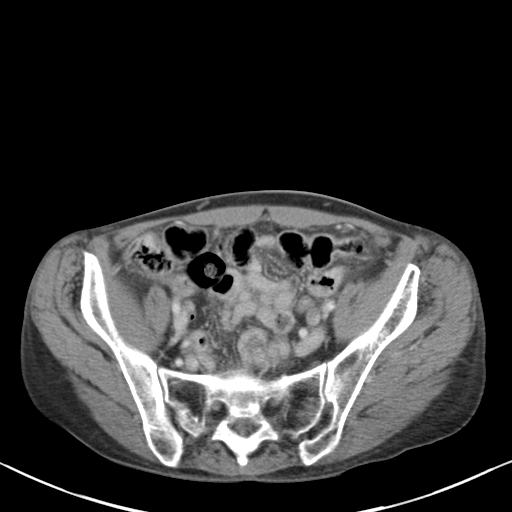
[im 33/77  soft-tissue]
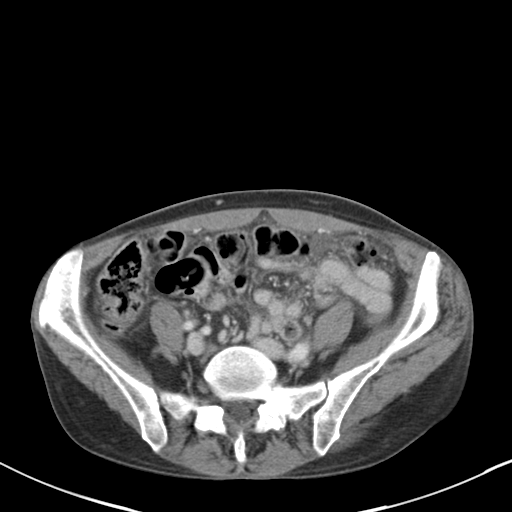
[im 44/77  soft-tissue]
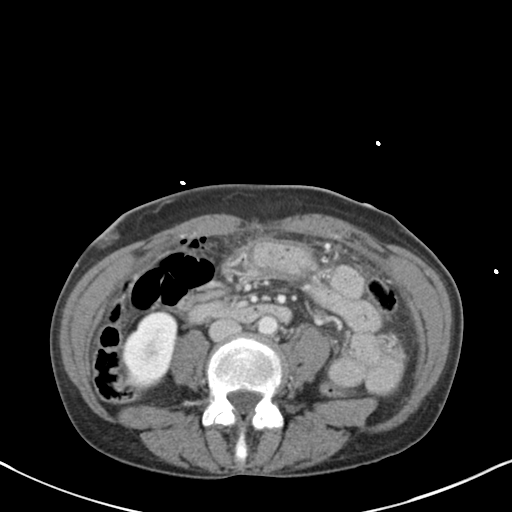
[im 49/77  soft-tissue]
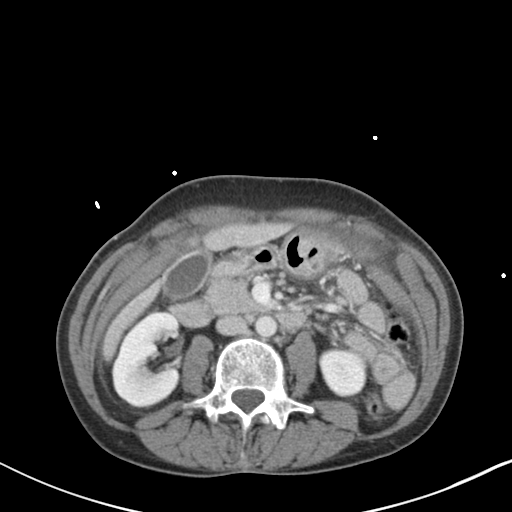
[im 55/77  soft-tissue]
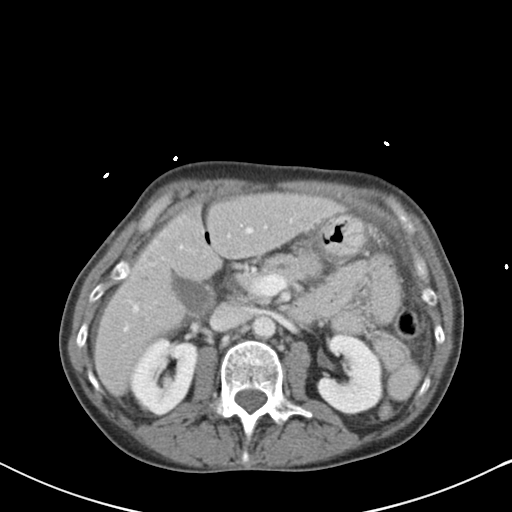
[im 55/77  bone]
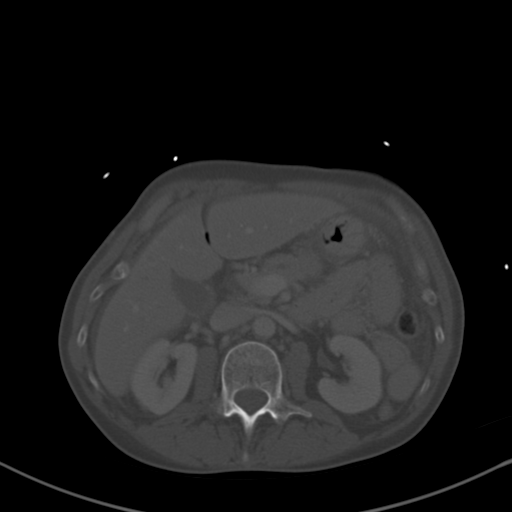
[im 60/77  soft-tissue]
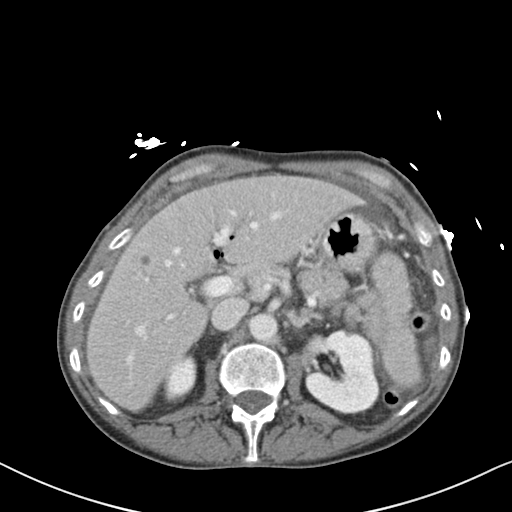
[im 66/77  soft-tissue]
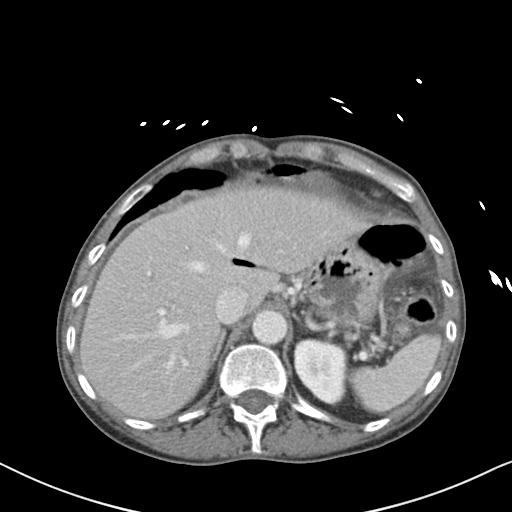
[im 71/77  soft-tissue]
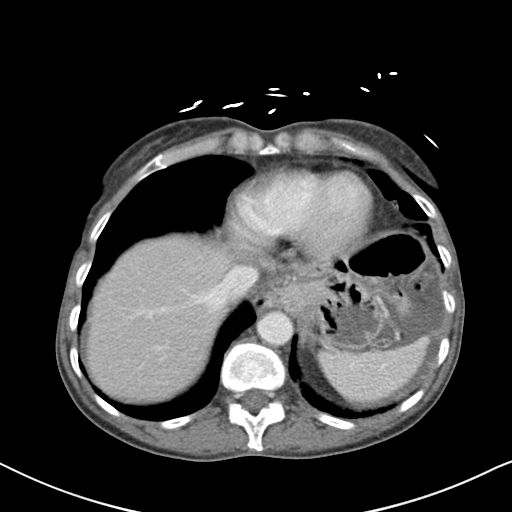

[Series 6: coronal a/|p · coronal · 0.66mm/px · 3 of 136 slices shown]
[im 46/136  soft-tissue]
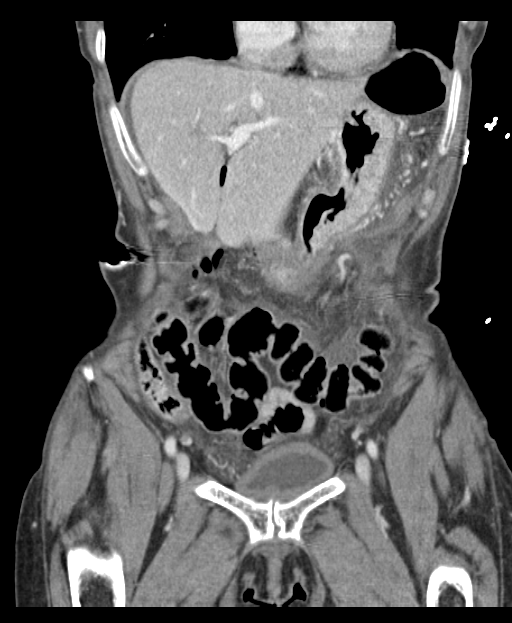
[im 61/136  soft-tissue]
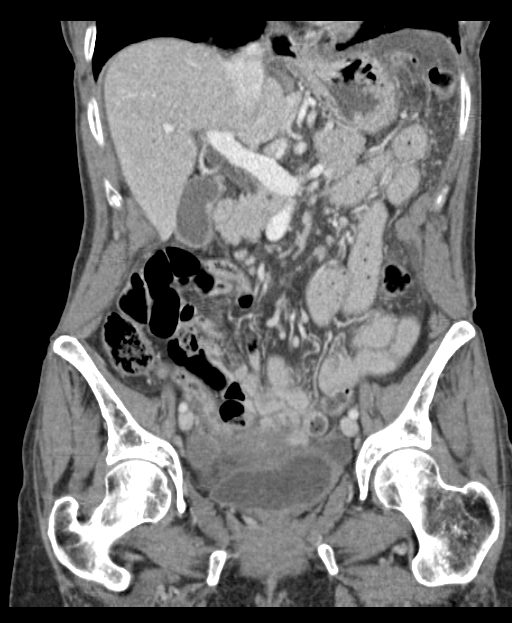
[im 76/136  soft-tissue]
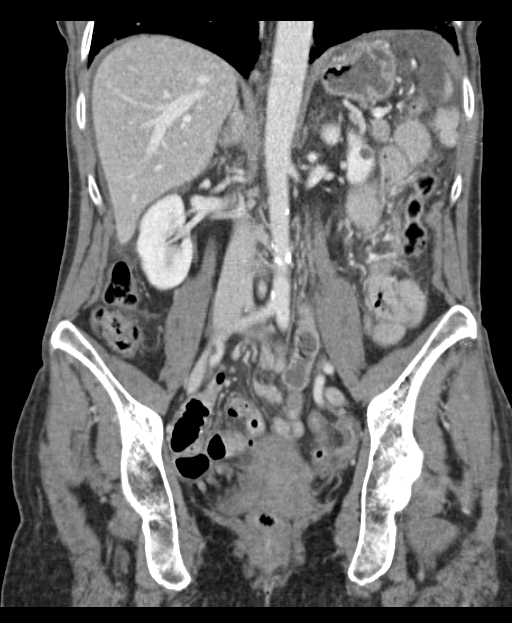

[15 of 46 positions shown; findings below may reference images not displayed]

FINDINGS: Lower chest: Mild circumferential wall thickening noted distal
esophagus (image 1 series 2) in this patient with a small hiatal
hernia.

Hepatobiliary: 6 mm hypo attenuating lesion in the medial segment
left liver is stable, compatible with a tiny cyst. Probable stable
tiny cholesterol polyp in the gallbladder. No intrahepatic or
extrahepatic biliary dilation.

Pancreas: No focal mass lesion. No dilatation of the main duct. No
intraparenchymal cyst. No peripancreatic edema.

Spleen: No splenomegaly. No focal mass lesion.

Adrenals/Urinary Tract: 11 mm left adrenal nodule unchanged in the
2.5 year history. Right adrenal gland is normal in appearance. Right
kidney is unremarkable. 7 mm exophytic low-density lesion interpolar
left kidney is unchanged, likely a tiny cyst. There is no adnexal
mass. The urinary bladder appears normal for the degree of
distention.

Stomach/Bowel: Small hiatal hernia noted. Stomach otherwise
unremarkable. Duodenum is normally positioned as is the ligament of
Treitz. No small bowel wall thickening. No small bowel dilatation.
The terminal ileum is normal. Although not well seen, the appendix
is visible on image 51 series 2 and has normal CT imaging features.
No gross colonic mass. No colonic wall thickening. No substantial
diverticular change.

Vascular/Lymphatic: There is abdominal aortic atherosclerosis
without aneurysm. There is no gastrohepatic or hepatoduodenal
ligament lymphadenopathy. No intraperitoneal or retroperitoneal
lymphadenopathy. Portal vein and superior mesenteric vein are
patent. Celiac axis, superior mesenteric artery, and inferior
mesenteric artery all opacified. Perigastric and paraesophageal
varices are evident.

Reproductive: The uterus has normal CT imaging appearance. There is
no adnexal mass.

Other: Small volume intraperitoneal free fluid evident in the
abdomen and pelvis. There is some vascular collateralization in the
left upper quadrant anterior to the stomach, indeterminate, but
portal venous hypertension would be a consideration.

Small volume intraperitoneal free air is identified adjacent to the
liver and under the anterior abdominal wall.

Musculoskeletal: Bone windows reveal no worrisome lytic or sclerotic
osseous lesions.
IMPRESSION: 1. Small volume intraperitoneal free air, consistent with hollow
viscus perforation. The site of perforation is not evident by CT
imaging today.
2. Prominent venous collateralization in the left upper quadrant
with perigastric and paraesophageal varices. Although the liver does
not appear overtly cirrhotic by imaging, the presence of varices
suggests portal venous hypertension.
3. Circumferential wall thickening in the distal esophagus,
incompletely visualized. Small hiatal hernia associated. Changes in
the esophagus may be related to esophagitis, but neoplasm could have
this appearance.
4. Stable appearance of probable tiny cholesterol polyp in the
gallbladder.
Critical Value/emergent results were called by telephone at the time
of interpretation on 11/17/2015 at [DATE] to Dr. DAVIAN TURNER SAVERY , who
verbally acknowledged these results.

## 2019-10-24 ENCOUNTER — Other Ambulatory Visit (HOSPITAL_COMMUNITY)
Admission: RE | Admit: 2019-10-24 | Discharge: 2019-10-24 | Disposition: A | Discharge status: Home / Self Care | Payer: Medicaid Other | Source: Ambulatory Visit | Attending: Obstetrics & Gynecology | Admitting: Obstetrics & Gynecology

## 2019-10-24 ENCOUNTER — Ambulatory Visit (INDEPENDENT_AMBULATORY_CARE_PROVIDER_SITE_OTHER): Payer: Medicaid Other | Admitting: Obstetrics & Gynecology

## 2019-10-24 ENCOUNTER — Encounter: Payer: Self-pay | Admitting: Obstetrics & Gynecology

## 2019-10-24 ENCOUNTER — Other Ambulatory Visit: Payer: Self-pay

## 2019-10-24 VITALS — BP 120/80 | Ht 64.0 in | Wt 131.0 lb

## 2019-10-24 DIAGNOSIS — R1031 Right lower quadrant pain: Secondary | ICD-10-CM | POA: Diagnosis not present

## 2019-10-24 DIAGNOSIS — Z124 Encounter for screening for malignant neoplasm of cervix: Secondary | ICD-10-CM | POA: Diagnosis not present

## 2019-10-24 DIAGNOSIS — N9489 Other specified conditions associated with female genital organs and menstrual cycle: Secondary | ICD-10-CM | POA: Diagnosis not present

## 2019-10-24 NOTE — H&P (View-Only) (Signed)
PRE-OPERATIVE HISTORY AND PHYSICAL EXAM  HPI:  Brandi Gonzales is a 64 y.o. [id number] No LMP recorded. Patient is postmenopausal.; she is being admitted for surgery related to adnexal mass.  Patient is a 64 y.o. [id number] who LMP was No LMP recorded. Patient is postmenopausal., presents today for a problem visit.  She complains of recent findings of Right adnexal mass by CT - Pelvis. She had CT in [place] while visiting family (I have no ability to see results).  Upon return, she saw her PCP and had MRI pelvis done, see below.  She reports 2-3 mos h/o bloating and RLQ pain (mild and intermittent, no radiation, no other context, no modifiers).  No periods for years.  When younger she had laparoscopy for ovarian cysts and was found to have endometriosis.  No pain or infertility then associated with this dx.  NSVD x3, prior BTL.  MRI 10/17/19 Reproductive:  -- Uterus: Measures 6.2 x 2.8 x 4.3 cm (volume = 39 cm^3). No  fibroids or other masses identified. Cervix and vagina are  unremarkable.    -- Right ovary: No normal ovary is visualized. A solid mass is seen  in the right adnexa which measures 4.6 by 4.0 x 5.0 cm. This mass  shows mildly heterogeneous T2 hypointensity, no evidence of internal  fat, and diffuse contrast enhancement. This mass also abuts, but  appears separate from, the uterus. 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    -- Left ovary: Normal postmenopausal appearance. No adnexal mass  identified.    Other: No evidence of free pelvic fluid or abdominal ascites.Marland Kitchen   PMHx: Past Medical History:  Diagnosis Date  . Acid reflux   . Alcoholism, chronic ([place])   . Depression    Past Surgical History:  Procedure Laterality Date  . LAPAROTOMY N/A 11/17/2015   Procedure: EXPLORATORY LAPAROTOMY, GRAHAM PATCH ANTRAL ULCER;  Surgeon: Leighton Ruff, MD;  Location: [facility];  Service: General;  Laterality: N/A;  . TUBAL  LIGATION     Family History  Problem Relation Age of Onset  . Cancer Mother   . Cancer Father   . Cancer Maternal Grandmother   . Cancer Maternal Grandfather    Social History   Tobacco Use  . Smoking status: Current Every Day Smoker  . Smokeless tobacco: Never Used  Vaping Use  . Vaping Use: Never used  Substance Use Topics  . Alcohol use: Yes    Alcohol/week: 40.0 standard drinks    Types: 40 Cans of beer per week  . Drug use: No    Current Outpatient Medications:  .  escitalopram (LEXAPRO) 10 MG tablet, Take 1 tablet (10 mg total) by mouth daily. For depression, Disp: 30 tablet, Rfl: 0 .  gabapentin (NEURONTIN) 100 MG capsule, Take 1 capsule (100 mg total) by mouth 3 (three) times daily. For alcohol withdrawal syndrome, Disp: 90 capsule, Rfl: 0 .  ondansetron (ZOFRAN-ODT) 4 MG disintegrating tablet, Take 1 tablet (4 mg total) by mouth every 6 (six) hours as needed for nausea., Disp: 20 tablet, Rfl: 0 .  traZODone (DESYREL) 100 MG tablet, Take 1 tablet (100 mg total) by mouth at bedtime. For sleep, Disp: 30 tablet, Rfl: 0 .  acetaminophen (TYLENOL) 500 MG tablet, Take 2 tablets (1,000 mg total) by mouth 3 (three) times daily. (Patient not taking: Reported on 10/24/2019), Disp: 30 tablet, Rfl: 0 .  alendronate (FOSAMAX) 70 MG tablet, Take 70 mg  by mouth once a week. mondays (Patient not taking: Reported on 10/24/2019), Disp: , Rfl:  .  amoxicillin (AMOXIL) 500 MG capsule, Take 2 capsules (1,000 mg total) by mouth every 12 (twelve) hours. (Patient not taking: Reported on 10/24/2019), Disp: 28 capsule, Rfl: 0 .  clarithromycin (BIAXIN) 500 MG tablet, Take 1 tablet (500 mg total) by mouth every 12 (twelve) hours. (Patient not taking: Reported on 10/24/2019), Disp: 28 tablet, Rfl: 0 .  nicotine (NICODERM CQ - DOSED IN MG/24 HOURS) 21 mg/24hr patch, Place 1 patch (21 mg total) onto the skin daily. (Patient not taking: Reported on 10/24/2019), Disp: 28 patch, Rfl: 0 .  nicotine (NICODERM CQ) 14  mg/24hr patch, Place 1 patch (14 mg total) onto the skin daily. (Patient not taking: Reported on 10/24/2019), Disp: 28 patch, Rfl: 0 .  oxyCODONE (OXY IR/ROXICODONE) 5 MG immediate release tablet, Take 1-2 tablets (5-10 mg total) by mouth every 4 (four) hours as needed for moderate pain. (Patient not taking: Reported on 10/24/2019), Disp: 30 tablet, Rfl: 0 .  pantoprazole (PROTONIX) 40 MG tablet, Take 1 tablet (40 mg total) by mouth daily as needed (acid reflux). (Patient not taking: Reported on 10/24/2019), Disp: , Rfl:  .  potassium chloride SA (K-DUR,KLOR-CON) 20 MEQ tablet, Take 2 tablets (40 mEq total) by mouth 2 (two) times daily. (Patient not taking: Reported on 10/24/2019), Disp: 60 tablet, Rfl: 0 .  tetrahydrozoline (VISINE) 0.05 % ophthalmic solution, Place 1 drop into both eyes 2 (two) times daily. For eye irritation (Patient not taking: Reported on 10/24/2019), Disp: 15 mL, Rfl: 0 Allergies: Nsaids and Compazine [prochlorperazine]  Review of Systems  Constitutional: Negative for chills, fever and malaise/fatigue.  HENT: Negative for congestion, sinus pain and sore throat.   Eyes: Negative for blurred vision and pain.  Respiratory: Negative for cough and wheezing.   Cardiovascular: Negative for chest pain and leg swelling.  Gastrointestinal: Positive for abdominal pain and nausea. Negative for constipation, diarrhea, heartburn and vomiting.  Genitourinary: Negative for dysuria, frequency, hematuria and urgency.  Musculoskeletal: Negative for back pain, joint pain, myalgias and neck pain.  Skin: Negative for itching and rash.  Neurological: Negative for dizziness, tremors and weakness.  Endo/Heme/Allergies: Does not bruise/bleed easily.  Psychiatric/Behavioral: Negative for depression. The patient is not nervous/anxious and does not have insomnia.   All other systems reviewed and are negative.   Objective: BP 120/80   Ht 5\' 4"  (1.626 m)   Wt 131 lb (59.4 kg)   BMI 22.49 kg/m   Filed  Weights   10/24/19 1303  Weight: 131 lb (59.4 kg)   Physical Exam Constitutional:      General: She is not in acute distress.    Appearance: She is well-developed.  Genitourinary:     Pelvic exam was performed with patient supine.     Urethra, bladder, vagina, uterus and rectum normal.     No lesions in the vagina.     No vaginal bleeding.     No cervical motion tenderness, friability, lesion or polyp.     Uterus is mobile.     Uterus is not enlarged.     No uterine mass detected.    Uterus is midaxial.     No right or left adnexal mass present.     Right adnexa tender and full.     Left adnexa not tender.  HENT:     Head: Normocephalic and atraumatic. No laceration.     Right Ear: Hearing normal.  Left Ear: Hearing normal.     Mouth/Throat:     Pharynx: Uvula midline.  Eyes:     Pupils: Pupils are equal, round, and reactive to light.  Neck:     Thyroid: No thyromegaly.  Cardiovascular:     Rate and Rhythm: Normal rate and regular rhythm.     Heart sounds: No murmur heard.  No friction rub. No gallop.   Pulmonary:     Effort: Pulmonary effort is normal. No respiratory distress.     Breath sounds: Normal breath sounds. No wheezing.  Chest:     Breasts:        Right: No mass, skin change or tenderness.        Left: No mass, skin change or tenderness.  Abdominal:     General: Bowel sounds are normal. There is no distension.     Palpations: Abdomen is soft.     Tenderness: There is no abdominal tenderness. There is no rebound.  Musculoskeletal:        General: Normal range of motion.     Cervical back: Normal range of motion and neck supple.  Neurological:     Mental Status: She is alert and oriented to person, place, and time.     Cranial Nerves: No cranial nerve deficit.  Skin:    General: Skin is warm and dry.  Psychiatric:        Judgment: Judgment normal.  Vitals reviewed.     Assessment: 1. Adnexal mass   2. Screening for cervical cancer   3. RLQ  abdominal pain   Plan surgery for ovarian mass  Laparoscopy, possible laparotomy BSO  Pros and cons, side effects, recovery discussed Risks for cancer  I have had a careful discussion with this patient about all the options available and the risk/benefits of each. I have fully informed this patient that surgery may subject her to a variety of discomforts and risks: She understands that most patients have surgery with little difficulty, but problems can happen ranging from minor to fatal. These include nausea, vomiting, pain, bleeding, infection, poor healing, hernia, or formation of adhesions. Unexpected reactions may occur from any drug or anesthetic given. Unintended injury may occur to other pelvic or abdominal structures such as Fallopian tubes, ovaries, bladder, ureter (tube from kidney to bladder), or bowel. Nerves going from the pelvis to the legs may be injured. Any such injury may require immediate or later additional surgery to correct the problem. Excessive blood loss requiring transfusion is very unlikely but possible. Dangerous blood clots may form in the legs or lungs. Physical and sexual activity will be restricted in varying degrees for an indeterminate period of time but most often 2-6 weeks.  Finally, she understands that it is impossible to list every possible undesirable effect and that the condition for which surgery is done is not always cured or significantly improved, and in rare cases may be even worse.Ample time was given to answer all questions.  Barnett Applebaum, MD, [facility], [facility] 10/24/2019  1:55 PM

## 2019-10-24 NOTE — Progress Notes (Signed)
HPI: Patient is a 64 y.o. [id number] who LMP was No LMP recorded. Patient is postmenopausal., presents today for a problem visit.  She complains of recent findings of Right adnexal mass by CT - Pelvis. She had CT in [place] while visiting family (I have no ability to see results).  Upon return, she saw her PCP and had MRI pelvis done, see below.  She reports 2-3 mos h/o bloating and RLQ pain (mild and intermittent, no radiation, no other context, no modifiers).  No periods for years.  When younger she had laparoscopy for ovarian cysts and was found to have endometriosis.  No pain or infertility then associated with this dx.  NSVD x3, prior BTL.  MRI 10/17/19 Reproductive:  -- Uterus: Measures 6.2 x 2.8 x 4.3 cm (volume = 39 cm^3). No  fibroids or other masses identified. Cervix and vagina are  unremarkable.    -- Right ovary: No normal ovary is visualized. A solid mass is seen  in the right adnexa which measures 4.6 by 4.0 x 5.0 cm. This mass  shows mildly heterogeneous T2 hypointensity, no evidence of internal  fat, and diffuse contrast enhancement. This mass also abuts, but  appears separate from, the uterus. 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    -- Left ovary: Normal postmenopausal appearance. No adnexal mass  identified.    Other: No evidence of free pelvic fluid or abdominal ascites.Marland Kitchen   PMHx: She  has a past medical history of Acid reflux, Alcoholism, chronic ([place]), and Depression. Also,  has a past surgical history that includes Tubal ligation and laparotomy (N/A, 11/17/2015)., family history includes Cancer in her father, maternal grandfather, maternal grandmother, and mother.,  reports that she has been smoking. She has never used smokeless tobacco. She reports current alcohol use of about 40.0 standard drinks of alcohol per week. She reports that she does not use drugs.  She has a current medication list which  includes the following prescription(s): escitalopram, gabapentin, ondansetron, trazodone, acetaminophen, alendronate, amoxicillin, clarithromycin, nicotine, nicotine, oxycodone, pantoprazole, potassium chloride sa, and tetrahydrozoline. Also, is allergic to nsaids and compazine [prochlorperazine].  Review of Systems  Constitutional: Negative for chills, fever and malaise/fatigue.  HENT: Negative for congestion, sinus pain and sore throat.   Eyes: Negative for blurred vision and pain.  Respiratory: Negative for cough and wheezing.   Cardiovascular: Negative for chest pain and leg swelling.  Gastrointestinal: Positive for abdominal pain and nausea. Negative for constipation, diarrhea, heartburn and vomiting.  Genitourinary: Negative for dysuria, frequency, hematuria and urgency.  Musculoskeletal: Negative for back pain, joint pain, myalgias and neck pain.  Skin: Negative for itching and rash.  Neurological: Negative for dizziness, tremors and weakness.  Endo/Heme/Allergies: Does not bruise/bleed easily.  Psychiatric/Behavioral: Negative for depression. The patient is not nervous/anxious and does not have insomnia.     Objective: BP 120/80   Ht 5\' 4"  (1.626 m)   Wt 131 lb (59.4 kg)   BMI 22.49 kg/m  Physical Exam Constitutional:      General: She is not in acute distress.    Appearance: She is well-developed.  Genitourinary:     Pelvic exam was performed with patient supine.     Urethra, bladder, vagina, uterus and rectum normal.     No lesions in the vagina.     No vaginal bleeding.     No cervical motion tenderness, friability, lesion or polyp.     Uterus is mobile.  Uterus is not enlarged.     No uterine mass detected.    Uterus is midaxial.     No right or left adnexal mass present.     Right adnexa tender and full.     Left adnexa not tender.  HENT:     Head: Normocephalic and atraumatic. No laceration.     Right Ear: Hearing normal.     Left Ear: Hearing normal.      Mouth/Throat:     Pharynx: Uvula midline.  Eyes:     Pupils: Pupils are equal, round, and reactive to light.  Neck:     Thyroid: No thyromegaly.  Cardiovascular:     Rate and Rhythm: Normal rate and regular rhythm.     Heart sounds: No murmur heard.  No friction rub. No gallop.   Pulmonary:     Effort: Pulmonary effort is normal. No respiratory distress.     Breath sounds: Normal breath sounds. No wheezing.  Chest:     Breasts:        Right: No mass, skin change or tenderness.        Left: No mass, skin change or tenderness.  Abdominal:     General: Bowel sounds are normal. There is no distension.     Palpations: Abdomen is soft.     Tenderness: There is no abdominal tenderness. There is no rebound.  Musculoskeletal:        General: Normal range of motion.     Cervical back: Normal range of motion and neck supple.  Neurological:     Mental Status: She is alert and oriented to person, place, and time.     Cranial Nerves: No cranial nerve deficit.  Skin:    General: Skin is warm and dry.  Psychiatric:        Judgment: Judgment normal.  Vitals reviewed.     ASSESSMENT/PLAN:    Problem List Items Addressed This Visit   Visit Diagnoses    Adnexal mass    -  Primary   Screening for cervical cancer       RLQ abdominal pain        Plan CA125 and surgery planning today.  Rec BSO, hopefully by laparoscopy.  If Elevated CA125 and risk for cancer, then will get Gyn Onc referral first before more extensive surgery planning.  Risks of cancer even w normal CA125 discussed.  Surgery pros and cons counseled.  Recovery discussed.  Barnett Applebaum, MD, [facility], [facility] 10/24/2019  1:31 PM

## 2019-10-24 NOTE — Patient Instructions (Signed)
Ovarian Tumors  Ovarian tumors are solid growths on the organs that produce eggs in women (ovaries). The ovaries are located on each side of the uterus. Tumors are different from ovarian cysts, which are filled with fluid. Tumors can be cancerous or noncancerous. All solid tumors should be checked by a health care provider to make sure that they are not cancerous. What are the causes? The cause of this condition is not known. What increases the risk? This condition is more likely to develop in women who:  Have gone through menopause.  Are older than 50 years.  Are of [place] or [place] descent.  Have a personal or family history of endometrial, ovarian, colon, or breast cancer.  Are obese.  Carry either of the genes associated with breast cancer (BRCA1 or BRCA2).  Use fertility medicines.  Use estrogen after menopause.  Are pregnant for the first time at age 79 or older.  Have never carried a pregnancy to term.  Have never been pregnant. What are the signs or symptoms? Noncancerous tumors may not cause any symptoms. Cancerous tumors may cause symptoms that are minor and resemble other health problems. Symptoms of cancerous ovarian tumors include:  Unexplained weight loss.  Increase in the size of the abdomen.  Pain in the abdomen.  Pain or pressure in the back or the area between the hip bones (pelvis).  Tiredness.  Abnormal vaginal bleeding.  Loss of appetite.  Frequent urination or pressure on your bladder.  Indigestion, increased gas, and bloating.  Pain during sex. How is this diagnosed? This condition may be diagnosed based on the results of:  A pelvic examination.  Blood tests.  Imaging procedures such as ultrasound, X-ray, CT scan, or MRI.  A biopsy. This is a test in which a small piece of tumor tissue is removed and examined under a microscope.  Laparoscopy. This is a procedure in which a thin, lighted tube is inserted into a  small incision in the lower abdomen and used to take images of your pelvic organs. How is this treated? If you have a noncancerous tumor, the most common treatment is surgery to remove the tumor. If you have a cancerous tumor, treatment may include one or more of the following:  Surgery to remove the tumor and ovary. In some cases, surgery may involve removing both ovaries, the fallopian tubes, uterus, cervix, and surrounding lymph nodes to check whether cancer has spread.  Radiation therapy. This is the use of high-energy beams to kill cancer cells.  Chemotherapy. This is the use of medicines to kill cancer cells.  Hormone therapy.  Targeted therapy. This means that treatment is aimed at specific cancer cells to avoid affecting normal cells. Follow these instructions at home:  Take over-the-counter and prescription medicines only as told by your health care provider.  Do not use any products that contain nicotine or tobacco, such as cigarettes and e-cigarettes. If you need help quitting, ask your health care provider.  Return to your normal activities as told by your health care provider. Ask your health care provider what activities are safe for you.  Have a yearly physical and gynecology exam. If you are 66 or older, this includes a pelvic exam.  Keep all follow-up visits as told by your health care provider. This is important. Contact a health care provider if:  You are losing weight for no reason.  You feel generally ill.  You have changes in your bowel or bladder function. Get help right  away if:  You have new or sudden symptoms that do not go away. Summary  Ovarian tumors are solid growths on the organs that produce eggs in women (ovaries). The ovaries are located on each side of the uterus.  Noncancerous tumors may not cause any symptoms. Cancerous tumors may cause symptoms that are minor and resemble other health problems.  If you have a noncancerous tumor, the most  common treatment is surgery to remove the tumor.  A cancerous tumor may be treated with surgery. Chemotherapy, targeted therapy, radiation therapy or a combination of these therapies may also be recommended.  Get help right away if you have new or sudden symptoms that do not go away. This information is not intended to replace advice given to you by your health care provider. Make sure you discuss any questions you have with your health care provider. Document Revised: 02/19/2017 Document Reviewed: 05/18/2016 Elsevier Patient Education  [place].   Bilateral Salpingo-Oophorectomy, Care After This sheet gives you information about how to care for yourself after your procedure. Your health care provider may also give you more specific instructions. If you have problems or questions, contact your health care provider. What can I expect after the procedure? After the procedure, it is common to have:  Abdominal pain.  Some occasional vaginal bleeding (spotting).  Tiredness.  Symptoms of menopause, such as hot flashes, night sweats, or mood swings. Follow these instructions at home: Incision care   Keep your incision area and your bandage (dressing) clean and dry.  Follow instructions from your health care provider about how to take care of your incision. Make sure you: ? Wash your hands with soap and water before you change your dressing. If soap and water are not available, use hand sanitizer. ? Change your dressing as told by your health care provider. ? Leave stitches (sutures), staples, skin glue, or adhesive strips in place. These skin closures may need to stay in place for 2 weeks or longer. If adhesive strip edges start to loosen and curl up, you may trim the loose edges. Do not remove adhesive strips completely unless your health care provider tells you to do that.  Check your incision area every day for signs of infection. Check for: ? Redness, swelling, or  pain. ? Fluid or blood. ? Warmth. ? Pus or a bad smell. Activity   Do not drive or use heavy machinery while taking prescription pain medicine.  Do not drive for 24 hours if you received a medicine to help you relax (sedative) during your procedure.  Take frequent, short walks throughout the day. Rest when you get tired. Ask your health care provider what activities are safe for you.  Avoid activity that requires great effort. Also, avoid heavy lifting. Do not lift anything that is heavier than 10 lbs. (4.5 kg), or the limit that your health care provider tells you, until he or she says that it is safe to do so.  Do not douche, use tampons, or have sex until your health care provider approves. General instructions   To prevent or treat constipation while you are taking prescription pain medicine, your health care provider may recommend that you: ? Drink enough fluid to keep your urine clear or pale yellow. ? Take over-the-counter or prescription medicines. ? Eat foods that are high in fiber, such as fresh fruits and vegetables, whole grains, and beans. ? Limit foods that are high in fat and processed sugars, such as fried and sweet  foods.  Take over-the-counter and prescription medicines only as told by your health care provider.  Do not take baths, swim, or use a hot tub until your health care provider approves. Ask your health care provider if you can take showers. You may only be allowed to take sponge baths for bathing.  Wear compression stockings as told by your health care provider. These stockings help to prevent blood clots and reduce swelling in your legs.  Keep all follow-up visits as told by your health care provider. This is important. Contact a health care provider if:  You have pain when you urinate.  You have pus or a bad smelling discharge coming from your vagina.  You have redness, swelling, or pain around your incision.  You have fluid or blood coming from  your incision.  Your incision feels warm to the touch.  You have pus or a bad smell coming from your incision.  You have a fever.  Your incision starts to break open.  You have pain in the abdomen, and it gets worse or does not get better when you take medicine.  You develop a rash.  You develop nausea and vomiting.  You feel lightheaded. Get help right away if:  You develop pain in your chest or leg.  You become short of breath.  You faint.  You have increased bleeding from your vagina. Summary  After the procedure, it is common to have pain, bleeding in the vagina, and symptoms of menopause.  Follow instructions from your health care provider about how to take care of your incision.  Follow instructions from your health care provider about activities and restrictions.  Check your incision every day for signs of infection and report any symptoms to your health care provider. This information is not intended to replace advice given to you by your health care provider. Make sure you discuss any questions you have with your health care provider. Document Revised: 05/13/2018 Document Reviewed: 04/13/2016 Elsevier Patient Education  2020 [employer].

## 2019-10-24 NOTE — Progress Notes (Signed)
PRE-OPERATIVE HISTORY AND PHYSICAL EXAM  HPI:  Brandi Gonzales is a 64 y.o. [id number] No LMP recorded. Patient is postmenopausal.; she is being admitted for surgery related to adnexal mass.  Patient is a 64 y.o. [id number] who LMP was No LMP recorded. Patient is postmenopausal., presents today for a problem visit.  She complains of recent findings of Right adnexal mass by CT - Pelvis. She had CT in [place] while visiting family (I have no ability to see results).  Upon return, she saw her PCP and had MRI pelvis done, see below.  She reports 2-3 mos h/o bloating and RLQ pain (mild and intermittent, no radiation, no other context, no modifiers).  No periods for years.  When younger she had laparoscopy for ovarian cysts and was found to have endometriosis.  No pain or infertility then associated with this dx.  NSVD x3, prior BTL.  MRI 10/17/19 Reproductive:  -- Uterus: Measures 6.2 x 2.8 x 4.3 cm (volume = 39 cm^3). No  fibroids or other masses identified. Cervix and vagina are  unremarkable.    -- Right ovary: No normal ovary is visualized. A solid mass is seen  in the right adnexa which measures 4.6 by 4.0 x 5.0 cm. This mass  shows mildly heterogeneous T2 hypointensity, no evidence of internal  fat, and diffuse contrast enhancement. This mass also abuts, but  appears separate from, the uterus. 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    -- Left ovary: Normal postmenopausal appearance. No adnexal mass  identified.    Other: No evidence of free pelvic fluid or abdominal ascites.Marland Kitchen   PMHx: Past Medical History:  Diagnosis Date  . Acid reflux   . Alcoholism, chronic ([place])   . Depression    Past Surgical History:  Procedure Laterality Date  . LAPAROTOMY N/A 11/17/2015   Procedure: EXPLORATORY LAPAROTOMY, GRAHAM PATCH ANTRAL ULCER;  Surgeon: Leighton Ruff, MD;  Location: [facility];  Service: General;  Laterality: N/A;  . TUBAL  LIGATION     Family History  Problem Relation Age of Onset  . Cancer Mother   . Cancer Father   . Cancer Maternal Grandmother   . Cancer Maternal Grandfather    Social History   Tobacco Use  . Smoking status: Current Every Day Smoker  . Smokeless tobacco: Never Used  Vaping Use  . Vaping Use: Never used  Substance Use Topics  . Alcohol use: Yes    Alcohol/week: 40.0 standard drinks    Types: 40 Cans of beer per week  . Drug use: No    Current Outpatient Medications:  .  escitalopram (LEXAPRO) 10 MG tablet, Take 1 tablet (10 mg total) by mouth daily. For depression, Disp: 30 tablet, Rfl: 0 .  gabapentin (NEURONTIN) 100 MG capsule, Take 1 capsule (100 mg total) by mouth 3 (three) times daily. For alcohol withdrawal syndrome, Disp: 90 capsule, Rfl: 0 .  ondansetron (ZOFRAN-ODT) 4 MG disintegrating tablet, Take 1 tablet (4 mg total) by mouth every 6 (six) hours as needed for nausea., Disp: 20 tablet, Rfl: 0 .  traZODone (DESYREL) 100 MG tablet, Take 1 tablet (100 mg total) by mouth at bedtime. For sleep, Disp: 30 tablet, Rfl: 0 .  acetaminophen (TYLENOL) 500 MG tablet, Take 2 tablets (1,000 mg total) by mouth 3 (three) times daily. (Patient not taking: Reported on 10/24/2019), Disp: 30 tablet, Rfl: 0 .  alendronate (FOSAMAX) 70 MG tablet, Take 70 mg  by mouth once a week. mondays (Patient not taking: Reported on 10/24/2019), Disp: , Rfl:  .  amoxicillin (AMOXIL) 500 MG capsule, Take 2 capsules (1,000 mg total) by mouth every 12 (twelve) hours. (Patient not taking: Reported on 10/24/2019), Disp: 28 capsule, Rfl: 0 .  clarithromycin (BIAXIN) 500 MG tablet, Take 1 tablet (500 mg total) by mouth every 12 (twelve) hours. (Patient not taking: Reported on 10/24/2019), Disp: 28 tablet, Rfl: 0 .  nicotine (NICODERM CQ - DOSED IN MG/24 HOURS) 21 mg/24hr patch, Place 1 patch (21 mg total) onto the skin daily. (Patient not taking: Reported on 10/24/2019), Disp: 28 patch, Rfl: 0 .  nicotine (NICODERM CQ) 14  mg/24hr patch, Place 1 patch (14 mg total) onto the skin daily. (Patient not taking: Reported on 10/24/2019), Disp: 28 patch, Rfl: 0 .  oxyCODONE (OXY IR/ROXICODONE) 5 MG immediate release tablet, Take 1-2 tablets (5-10 mg total) by mouth every 4 (four) hours as needed for moderate pain. (Patient not taking: Reported on 10/24/2019), Disp: 30 tablet, Rfl: 0 .  pantoprazole (PROTONIX) 40 MG tablet, Take 1 tablet (40 mg total) by mouth daily as needed (acid reflux). (Patient not taking: Reported on 10/24/2019), Disp: , Rfl:  .  potassium chloride SA (K-DUR,KLOR-CON) 20 MEQ tablet, Take 2 tablets (40 mEq total) by mouth 2 (two) times daily. (Patient not taking: Reported on 10/24/2019), Disp: 60 tablet, Rfl: 0 .  tetrahydrozoline (VISINE) 0.05 % ophthalmic solution, Place 1 drop into both eyes 2 (two) times daily. For eye irritation (Patient not taking: Reported on 10/24/2019), Disp: 15 mL, Rfl: 0 Allergies: Nsaids and Compazine [prochlorperazine]  Review of Systems  Constitutional: Negative for chills, fever and malaise/fatigue.  HENT: Negative for congestion, sinus pain and sore throat.   Eyes: Negative for blurred vision and pain.  Respiratory: Negative for cough and wheezing.   Cardiovascular: Negative for chest pain and leg swelling.  Gastrointestinal: Positive for abdominal pain and nausea. Negative for constipation, diarrhea, heartburn and vomiting.  Genitourinary: Negative for dysuria, frequency, hematuria and urgency.  Musculoskeletal: Negative for back pain, joint pain, myalgias and neck pain.  Skin: Negative for itching and rash.  Neurological: Negative for dizziness, tremors and weakness.  Endo/Heme/Allergies: Does not bruise/bleed easily.  Psychiatric/Behavioral: Negative for depression. The patient is not nervous/anxious and does not have insomnia.   All other systems reviewed and are negative.   Objective: BP 120/80   Ht 5\' 4"  (1.626 m)   Wt 131 lb (59.4 kg)   BMI 22.49 kg/m   Filed  Weights   10/24/19 1303  Weight: 131 lb (59.4 kg)   Physical Exam Constitutional:      General: She is not in acute distress.    Appearance: She is well-developed.  Genitourinary:     Pelvic exam was performed with patient supine.     Urethra, bladder, vagina, uterus and rectum normal.     No lesions in the vagina.     No vaginal bleeding.     No cervical motion tenderness, friability, lesion or polyp.     Uterus is mobile.     Uterus is not enlarged.     No uterine mass detected.    Uterus is midaxial.     No right or left adnexal mass present.     Right adnexa tender and full.     Left adnexa not tender.  HENT:     Head: Normocephalic and atraumatic. No laceration.     Right Ear: Hearing normal.  Left Ear: Hearing normal.     Mouth/Throat:     Pharynx: Uvula midline.  Eyes:     Pupils: Pupils are equal, round, and reactive to light.  Neck:     Thyroid: No thyromegaly.  Cardiovascular:     Rate and Rhythm: Normal rate and regular rhythm.     Heart sounds: No murmur heard.  No friction rub. No gallop.   Pulmonary:     Effort: Pulmonary effort is normal. No respiratory distress.     Breath sounds: Normal breath sounds. No wheezing.  Chest:     Breasts:        Right: No mass, skin change or tenderness.        Left: No mass, skin change or tenderness.  Abdominal:     General: Bowel sounds are normal. There is no distension.     Palpations: Abdomen is soft.     Tenderness: There is no abdominal tenderness. There is no rebound.  Musculoskeletal:        General: Normal range of motion.     Cervical back: Normal range of motion and neck supple.  Neurological:     Mental Status: She is alert and oriented to person, place, and time.     Cranial Nerves: No cranial nerve deficit.  Skin:    General: Skin is warm and dry.  Psychiatric:        Judgment: Judgment normal.  Vitals reviewed.     Assessment: 1. Adnexal mass   2. Screening for cervical cancer   3. RLQ  abdominal pain   Plan surgery for ovarian mass  Laparoscopy, possible laparotomy BSO  Pros and cons, side effects, recovery discussed Risks for cancer  I have had a careful discussion with this patient about all the options available and the risk/benefits of each. I have fully informed this patient that surgery may subject her to a variety of discomforts and risks: She understands that most patients have surgery with little difficulty, but problems can happen ranging from minor to fatal. These include nausea, vomiting, pain, bleeding, infection, poor healing, hernia, or formation of adhesions. Unexpected reactions may occur from any drug or anesthetic given. Unintended injury may occur to other pelvic or abdominal structures such as Fallopian tubes, ovaries, bladder, ureter (tube from kidney to bladder), or bowel. Nerves going from the pelvis to the legs may be injured. Any such injury may require immediate or later additional surgery to correct the problem. Excessive blood loss requiring transfusion is very unlikely but possible. Dangerous blood clots may form in the legs or lungs. Physical and sexual activity will be restricted in varying degrees for an indeterminate period of time but most often 2-6 weeks.  Finally, she understands that it is impossible to list every possible undesirable effect and that the condition for which surgery is done is not always cured or significantly improved, and in rare cases may be even worse.Ample time was given to answer all questions.  Barnett Applebaum, MD, [facility], [facility] 10/24/2019  1:55 PM

## 2019-10-25 ENCOUNTER — Telehealth: Payer: Self-pay

## 2019-10-25 LAB — CA 125: Cancer Antigen (CA) 125: 7.7 U/mL (ref 0.0–38.1)

## 2019-10-25 NOTE — Telephone Encounter (Signed)
Pt returning [facility] phone call. He was in a room when she called.

## 2019-10-25 NOTE — Telephone Encounter (Signed)
Pt calling states we were supposed to call her this am c blood work results.  [phone]

## 2019-10-25 NOTE — Telephone Encounter (Signed)
Please call [phone] for pt

## 2019-10-26 ENCOUNTER — Telehealth: Payer: Self-pay | Admitting: Obstetrics & Gynecology

## 2019-10-26 LAB — CYTOLOGY - PAP
Comment: NEGATIVE
Diagnosis: NEGATIVE
High risk HPV: POSITIVE — AB

## 2019-10-27 ENCOUNTER — Telehealth: Payer: Self-pay | Admitting: Obstetrics & Gynecology

## 2019-10-27 ENCOUNTER — Other Ambulatory Visit: Payer: Self-pay

## 2019-10-27 ENCOUNTER — Encounter
Admission: RE | Admit: 2019-10-27 | Discharge: 2019-10-27 | Disposition: A | Discharge status: Home / Self Care | Payer: Managed Care, Other (non HMO) | Source: Ambulatory Visit | Attending: Obstetrics & Gynecology | Admitting: Obstetrics & Gynecology

## 2019-10-27 HISTORY — DX: Anxiety disorder, unspecified: F41.9

## 2019-10-27 NOTE — Telephone Encounter (Signed)
-----   Message from Gae Dry, MD sent at 10/24/2019  1:31 PM EDT ----- Regarding: Surgery Surgery Booking Request Patient Full Name:  Brandi Gonzales  MRN: [id number]  DOB: Dec 15, 1955  Surgeon: Hoyt Koch, MD  Requested Surgery Date and Time: 11/02/2019 Primary Diagnosis : 1. Adnexal mass  N94.89  2. RLQ abdominal pain  R10.31  Secondary Diagnosis and Code:  Surgical Procedure: LAPAROSCOPY BSO RNFA Requested?: No L&D Notification: No Admission Status: same day surgery Length of Surgery: 50 min Special Case Needs: No H&P: No Phone Interview???:  Yes Interpreter: No Medical Clearance:  No Special Scheduling Instructions: No Any known health/anesthesia issues, diabetes, sleep apnea, latex allergy, defibrillator/pacemaker?: No Acuity: P2, Cancer risk   (P1 highest, P2 delay may cause harm, P3 low, elective gyn, P4 lowest)

## 2019-10-27 NOTE — Patient Instructions (Addendum)
Your procedure is scheduled on: 11/02/19 Report to [place]. To find out your arrival time please call [phone] between 1PM - 3PM on 11/01/19.  Remember: Instructions that are not followed completely may result in serious medical risk, up to and including death, or upon the discretion of your surgeon and anesthesiologist your surgery may need to be rescheduled.     _X__ 1. Do not eat food after midnight the night before your procedure.                 No gum chewing or hard candies. You may drink clear liquids up to 2 hours                 before you are scheduled to arrive for your surgery- DO not drink clear                 liquids within 2 hours of the start of your surgery.                 Clear Liquids include:  water, apple juice without pulp, clear carbohydrate                 drink such as Clearfast or Gatorade, Black Coffee or Tea (Do not add                 anything to coffee or tea). Diabetics water only  __X__2.  On the morning of surgery brush your teeth with toothpaste and water, you                 may rinse your mouth with mouthwash if you wish.  Do not swallow any              toothpaste of mouthwash.     _X__ 3.  No Alcohol for 24 hours before or after surgery.   _X__ 4.  Do Not Smoke or use e-cigarettes For 24 Hours Prior to Your Surgery.                 Do not use any chewable tobacco products for at least 6 hours prior to                 surgery.  ____  5.  Bring all medications with you on the day of surgery if instructed.   __X__  6.  Notify your doctor if there is any change in your medical condition      (cold, fever, infections).     Do not wear jewelry, make-up, hairpins, clips or nail polish. Do not wear lotions, powders, or perfumes.  Do not shave 48 hours prior to surgery. Men may shave face and neck. Do not bring valuables to the hospital.    [facility] is not responsible for any belongings or  valuables.  Contacts, dentures/partials or body piercings may not be worn into surgery. Bring a case for your contacts, glasses or hearing aids, a denture cup will be supplied. Leave your suitcase in the car. After surgery it may be brought to your room. For patients admitted to the hospital, discharge time is determined by your treatment team.   Patients discharged the day of surgery will not be allowed to drive home.   Please read over the following fact sheets that you were given:   MRSA Information  __X__ Take these medicines the morning of surgery with A SIP OF WATER:  1. gabapentin (NEURONTIN) 300 MG capsule  2. sertraline (ZOLOFT) 50 MG tablet  3. hydrOXYzine (ATARAX/VISTARIL) 25 MG tablet if needed  4.  5.  6.  ____ Fleet Enema (as directed)   __X__ Use CHG Soap/SAGE wipes as directed  ____ Use inhalers on the day of surgery  ____ Stop metformin/Janumet/Farxiga 2 days prior to surgery    ____ Take 1/2 of usual insulin dose the night before surgery. No insulin the morning          of surgery.   ____ Stop Blood Thinners Coumadin/Plavix/Xarelto/Pleta/Pradaxa/Eliquis/Effient/Aspirin  on   Or contact your Surgeon, Cardiologist or Medical Doctor regarding  ability to stop your blood thinners  __X__ Stop Anti-inflammatories 7 days before surgery such as Advil, Ibuprofen, Motrin,  BC or Goodies Powder, Naprosyn, Naproxen, Aleve, Aspirin    __X__ Stop all herbal supplements, fish oil or vitamin E until after surgery.    ____ Bring C-Pap to the hospital.      How to Use an Incentive Spirometer An incentive spirometer is a tool that measures how well you are filling your lungs with each breath. Learning to take long, deep breaths using this tool can help you keep your lungs clear and active. This may help to reverse or lessen your chance of developing breathing (pulmonary) problems, especially infection. You may be asked to use a spirometer:  After a surgery.  If you  have a lung problem or a history of smoking.  After a long period of time when you have been unable to move or be active. If the spirometer includes an indicator to show the highest number that you have reached, your health care provider or respiratory therapist will help you set a goal. Keep a list (log) of your progress as told by your health care provider. What are the risks?  Breathing too quickly may cause dizziness or cause you to pass out. Take your time so you do not get dizzy or light-headed.  If you are in pain, you may need to take pain medicine before doing incentive spirometry. It is harder to take a deep breath if you are having pain. How to use your incentive spirometer  1. Sit up on the edge of your bed or on a chair. 2. Hold the incentive spirometer so that it is in an upright position. 3. Before you use the spirometer, breathe out normally. 4. Place the mouthpiece in your mouth. Make sure your lips are closed tightly around it. 5. Breathe in slowly and as deeply as you can through your mouth, causing the piston or the ball to rise toward the top of the chamber. 6. Hold your breath for 3-5 seconds, or for as long as possible. ? If the spirometer includes a coach indicator, use this to guide you in breathing. Slow down your breathing if the indicator goes above the marked areas. 7. Remove the mouthpiece from your mouth and breathe out normally. The piston or ball will return to the bottom of the chamber. 8. Rest for a few seconds, then repeat the steps 10 or more times. ? Take your time and take a few normal breaths between deep breaths so that you do not get dizzy or light-headed. ? Do this every 1-2 hours when you are awake. 9. If the spirometer includes a goal marker to show the highest number you have reached (best effort), use this as a goal to work toward during each repetition. 10. After each set of 10 deep breaths,  cough a few times. This will help to make sure that  your lungs are clear. ? If you have an incision on your chest or abdomen from surgery, place a pillow or a rolled-up towel firmly against the incision when you cough. This can help to reduce pain from coughing. General tips  When you become able to get out of bed, walk around often and continue to cough to help clear your lungs.  Keep using the incentive spirometer until your health care provider says it is okay to stop using it. If you have been in the hospital, you may be told to keep using the spirometer at home. Contact a health care provider if:  You are having difficulty using the spirometer.  You have trouble using the spirometer as often as instructed.  Your pain medicine is not giving enough relief for you to use the spirometer as told.  You have a fever.  You develop shortness of breath. Get help right away if:  You develop a cough with bloody mucus from the lungs (bloody sputum).  You have fluid or blood coming from an incision site after you cough. Summary  An incentive spirometer is a tool that can help you learn to take long, deep breaths to keep your lungs clear and active.  You may be asked to use a spirometer after a surgery, if you have a lung problem or a history of smoking, or if you have been inactive for a long period of time.  Use your incentive spirometer as instructed every 1-2 hours while you are awake.  If you have an incision on your chest or abdomen, place a pillow or a rolled-up towel firmly against your incision when you cough. This will help to reduce pain. This information is not intended to replace advice given to you by your health care provider. Make sure you discuss any questions you have with your health care provider. Document Revised: 10/07/2018 Document Reviewed: 01/20/2017 Elsevier Patient Education  2020 [employer].

## 2019-10-27 NOTE — Telephone Encounter (Signed)
Pt returned call. I informed of schedule procedure with [facility]  DOS 8/12  H&P N/A  Covid testing 8/10 @ 8-10:30, [facility] [employer], drive up and wear mask. Advised pt to quarantine until DOS.  Pre-admit phone call appointment scheduled for today, 8/6. Adv pt to expect it before 1pm. Explained that this appointment has a call window. The call will be received within a 4 hour window before 1:00.  Advised that pt may also receive calls from the hospital pharmacy and pre-service center. She has already heard for [facility].  Confirmed pt has Medicaid as primary insurance. No secondary insurance.  Pt had questions regarding location of hospital. I adv that is right off of [address]. I gave her the address and phone number per her request.

## 2019-10-31 ENCOUNTER — Other Ambulatory Visit
Admission: RE | Admit: 2019-10-31 | Discharge: 2019-10-31 | Disposition: A | Discharge status: Home / Self Care | Payer: Medicaid Other | Source: Ambulatory Visit | Attending: Obstetrics & Gynecology | Admitting: Obstetrics & Gynecology

## 2019-10-31 ENCOUNTER — Other Ambulatory Visit: Payer: Self-pay

## 2019-10-31 DIAGNOSIS — Z01812 Encounter for preprocedural laboratory examination: Secondary | ICD-10-CM | POA: Diagnosis not present

## 2019-10-31 DIAGNOSIS — Z20822 Contact with and (suspected) exposure to covid-19: Secondary | ICD-10-CM | POA: Diagnosis not present

## 2019-10-31 LAB — CBC
HCT: 40.1 % (ref 36.0–46.0)
Hemoglobin: 13.4 g/dL (ref 12.0–15.0)
MCH: 30.6 pg (ref 26.0–34.0)
MCHC: 33.4 g/dL (ref 30.0–36.0)
MCV: 91.6 fL (ref 80.0–100.0)
Platelets: 258 10*3/uL (ref 150–400)
RBC: 4.38 MIL/uL (ref 3.87–5.11)
RDW: 12 % (ref 11.5–15.5)
WBC: 5.8 10*3/uL (ref 4.0–10.5)
nRBC: 0 % (ref 0.0–0.2)

## 2019-10-31 LAB — TYPE AND SCREEN
ABO/RH(D): A POS
Antibody Screen: NEGATIVE

## 2019-10-31 LAB — SARS CORONAVIRUS 2 (TAT 6-24 HRS): SARS Coronavirus 2: NEGATIVE

## 2019-11-01 MED ORDER — SODIUM CHLORIDE 0.9 % IV SOLN
2.0000 g | INTRAVENOUS | Status: AC
  Administered 2019-11-02: 2 g via INTRAVENOUS

## 2019-11-02 ENCOUNTER — Encounter
Admission: RE | Disposition: A | Discharge status: Home / Self Care | Payer: Self-pay | Source: Home / Self Care | Attending: Obstetrics & Gynecology

## 2019-11-02 ENCOUNTER — Ambulatory Visit
Admission: RE | Admit: 2019-11-02 | Discharge: 2019-11-02 | Disposition: A | Discharge status: Home / Self Care | Payer: Medicaid Other | Attending: Obstetrics & Gynecology | Admitting: Obstetrics & Gynecology

## 2019-11-02 ENCOUNTER — Ambulatory Visit: Payer: Medicaid Other | Admitting: Certified Registered Nurse Anesthetist

## 2019-11-02 ENCOUNTER — Encounter: Payer: Self-pay | Admitting: Obstetrics & Gynecology

## 2019-11-02 ENCOUNTER — Other Ambulatory Visit: Payer: Self-pay

## 2019-11-02 DIAGNOSIS — K219 Gastro-esophageal reflux disease without esophagitis: Secondary | ICD-10-CM | POA: Diagnosis not present

## 2019-11-02 DIAGNOSIS — N959 Unspecified menopausal and perimenopausal disorder: Secondary | ICD-10-CM | POA: Insufficient documentation

## 2019-11-02 DIAGNOSIS — F1020 Alcohol dependence, uncomplicated: Secondary | ICD-10-CM | POA: Diagnosis not present

## 2019-11-02 DIAGNOSIS — Z79899 Other long term (current) drug therapy: Secondary | ICD-10-CM | POA: Diagnosis not present

## 2019-11-02 DIAGNOSIS — R1031 Right lower quadrant pain: Secondary | ICD-10-CM | POA: Diagnosis not present

## 2019-11-02 DIAGNOSIS — N9489 Other specified conditions associated with female genital organs and menstrual cycle: Secondary | ICD-10-CM | POA: Diagnosis not present

## 2019-11-02 DIAGNOSIS — D270 Benign neoplasm of right ovary: Secondary | ICD-10-CM | POA: Diagnosis not present

## 2019-11-02 DIAGNOSIS — Z886 Allergy status to analgesic agent status: Secondary | ICD-10-CM | POA: Diagnosis not present

## 2019-11-02 DIAGNOSIS — F329 Major depressive disorder, single episode, unspecified: Secondary | ICD-10-CM | POA: Insufficient documentation

## 2019-11-02 DIAGNOSIS — Z7983 Long term (current) use of bisphosphonates: Secondary | ICD-10-CM | POA: Diagnosis not present

## 2019-11-02 DIAGNOSIS — D271 Benign neoplasm of left ovary: Secondary | ICD-10-CM | POA: Diagnosis not present

## 2019-11-02 DIAGNOSIS — Z888 Allergy status to other drugs, medicaments and biological substances status: Secondary | ICD-10-CM | POA: Insufficient documentation

## 2019-11-02 DIAGNOSIS — F17200 Nicotine dependence, unspecified, uncomplicated: Secondary | ICD-10-CM | POA: Diagnosis not present

## 2019-11-02 DIAGNOSIS — N838 Other noninflammatory disorders of ovary, fallopian tube and broad ligament: Secondary | ICD-10-CM | POA: Diagnosis not present

## 2019-11-02 DIAGNOSIS — Z302 Encounter for sterilization: Secondary | ICD-10-CM | POA: Diagnosis not present

## 2019-11-02 HISTORY — PX: LAPAROSCOPIC BILATERAL SALPINGO OOPHERECTOMY: SHX5890

## 2019-11-02 SURGERY — SALPINGO-OOPHORECTOMY, BILATERAL, LAPAROSCOPIC
Anesthesia: General | Laterality: Bilateral

## 2019-11-02 MED ORDER — ORAL CARE MOUTH RINSE: 15.0000 mL | Freq: Once | OROMUCOSAL | Status: AC

## 2019-11-02 MED ORDER — PROPOFOL 10 MG/ML IV BOLUS
INTRAVENOUS | Status: AC
  Filled 2019-11-02: qty 20

## 2019-11-02 MED ORDER — ACETAMINOPHEN 500 MG PO TABS
1000.0000 mg | ORAL_TABLET | Freq: Once | ORAL | Status: AC
  Administered 2019-11-02: 1000 mg via ORAL

## 2019-11-02 MED ORDER — OXYCODONE-ACETAMINOPHEN 5-325 MG PO TABS: 1.0000 | ORAL_TABLET | ORAL | 0 refills | Status: AC | PRN

## 2019-11-02 MED ORDER — ACETAMINOPHEN 160 MG/5ML PO SOLN
325.0000 mg | ORAL | Status: DC | PRN
  Filled 2019-11-02: qty 20.3

## 2019-11-02 MED ORDER — ROCURONIUM BROMIDE 10 MG/ML (PF) SYRINGE
PREFILLED_SYRINGE | INTRAVENOUS | Status: AC
  Filled 2019-11-02: qty 10

## 2019-11-02 MED ORDER — GLYCOPYRROLATE 0.2 MG/ML IJ SOLN
INTRAMUSCULAR | Status: DC | PRN
  Administered 2019-11-02: .2 mg via INTRAVENOUS

## 2019-11-02 MED ORDER — LACTATED RINGERS IV SOLN: INTRAVENOUS | Status: DC

## 2019-11-02 MED ORDER — MIDAZOLAM HCL 2 MG/2ML IJ SOLN
INTRAMUSCULAR | Status: AC
  Filled 2019-11-02: qty 2

## 2019-11-02 MED ORDER — SUGAMMADEX SODIUM 200 MG/2ML IV SOLN
INTRAVENOUS | Status: DC | PRN
  Administered 2019-11-02: 200 mg via INTRAVENOUS

## 2019-11-02 MED ORDER — LIDOCAINE HCL (CARDIAC) PF 100 MG/5ML IV SOSY
PREFILLED_SYRINGE | INTRAVENOUS | Status: DC | PRN
  Administered 2019-11-02: 50 mg via INTRAVENOUS

## 2019-11-02 MED ORDER — ONDANSETRON HCL 4 MG/2ML IJ SOLN
INTRAMUSCULAR | Status: DC | PRN
  Administered 2019-11-02: 4 mg via INTRAVENOUS

## 2019-11-02 MED ORDER — ACETAMINOPHEN 650 MG RE SUPP
650.0000 mg | RECTAL | Status: DC | PRN
  Filled 2019-11-02: qty 1

## 2019-11-02 MED ORDER — POVIDONE-IODINE 10 % EX SWAB: 2.0000 "application " | Freq: Once | CUTANEOUS | Status: DC

## 2019-11-02 MED ORDER — BUPIVACAINE HCL (PF) 0.5 % IJ SOLN
INTRAMUSCULAR | Status: AC
  Filled 2019-11-02: qty 30

## 2019-11-02 MED ORDER — EPHEDRINE SULFATE 50 MG/ML IJ SOLN
INTRAMUSCULAR | Status: DC | PRN
  Administered 2019-11-02: 10 mg via INTRAVENOUS

## 2019-11-02 MED ORDER — OXYCODONE HCL 5 MG PO TABS: 5.0000 mg | ORAL_TABLET | Freq: Once | ORAL | Status: AC

## 2019-11-02 MED ORDER — MORPHINE SULFATE (PF) 2 MG/ML IV SOLN: 1.0000 mg | INTRAVENOUS | Status: DC | PRN

## 2019-11-02 MED ORDER — MEPERIDINE HCL 50 MG/ML IJ SOLN: 6.2500 mg | INTRAMUSCULAR | Status: DC | PRN

## 2019-11-02 MED ORDER — DEXAMETHASONE SODIUM PHOSPHATE 10 MG/ML IJ SOLN
INTRAMUSCULAR | Status: AC
  Filled 2019-11-02: qty 1

## 2019-11-02 MED ORDER — OXYCODONE-ACETAMINOPHEN 5-325 MG PO TABS: 1.0000 | ORAL_TABLET | ORAL | Status: DC | PRN

## 2019-11-02 MED ORDER — FENTANYL CITRATE (PF) 100 MCG/2ML IJ SOLN
INTRAMUSCULAR | Status: AC
  Administered 2019-11-02: 25 ug via INTRAVENOUS
  Filled 2019-11-02: qty 2

## 2019-11-02 MED ORDER — FENTANYL CITRATE (PF) 250 MCG/5ML IJ SOLN
INTRAMUSCULAR | Status: DC | PRN
  Administered 2019-11-02 (×2): 50 ug via INTRAVENOUS

## 2019-11-02 MED ORDER — HYDROCODONE-ACETAMINOPHEN 7.5-325 MG PO TABS: 1.0000 | ORAL_TABLET | Freq: Once | ORAL | Status: DC | PRN

## 2019-11-02 MED ORDER — CHLORHEXIDINE GLUCONATE 0.12 % MT SOLN: 15.0000 mL | Freq: Once | OROMUCOSAL | Status: AC

## 2019-11-02 MED ORDER — ACETAMINOPHEN 325 MG PO TABS: 325.0000 mg | ORAL_TABLET | ORAL | Status: DC | PRN

## 2019-11-02 MED ORDER — FENTANYL CITRATE (PF) 100 MCG/2ML IJ SOLN
25.0000 ug | INTRAMUSCULAR | Status: DC | PRN
  Administered 2019-11-02: 50 ug via INTRAVENOUS
  Administered 2019-11-02: 25 ug via INTRAVENOUS
  Administered 2019-11-02: 50 ug via INTRAVENOUS

## 2019-11-02 MED ORDER — FAMOTIDINE 20 MG PO TABS
ORAL_TABLET | ORAL | Status: AC
  Administered 2019-11-02: 20 mg via ORAL
  Filled 2019-11-02: qty 1

## 2019-11-02 MED ORDER — SODIUM CHLORIDE 0.9 % IV SOLN
INTRAVENOUS | Status: AC
  Filled 2019-11-02: qty 2

## 2019-11-02 MED ORDER — DEXAMETHASONE SODIUM PHOSPHATE 10 MG/ML IJ SOLN
INTRAMUSCULAR | Status: DC | PRN
  Administered 2019-11-02: 8 mg via INTRAVENOUS

## 2019-11-02 MED ORDER — OXYCODONE HCL 5 MG PO TABS
ORAL_TABLET | ORAL | Status: AC
  Administered 2019-11-02: 5 mg via ORAL
  Filled 2019-11-02: qty 1

## 2019-11-02 MED ORDER — FENTANYL CITRATE (PF) 100 MCG/2ML IJ SOLN
INTRAMUSCULAR | Status: AC
  Administered 2019-11-02: 50 ug via INTRAVENOUS
  Filled 2019-11-02: qty 2

## 2019-11-02 MED ORDER — FAMOTIDINE 20 MG PO TABS: 20.0000 mg | ORAL_TABLET | Freq: Once | ORAL | Status: AC

## 2019-11-02 MED ORDER — EPHEDRINE 5 MG/ML INJ
INTRAVENOUS | Status: AC
  Filled 2019-11-02: qty 10

## 2019-11-02 MED ORDER — PROPOFOL 10 MG/ML IV BOLUS
INTRAVENOUS | Status: DC | PRN
  Administered 2019-11-02: 100 mg via INTRAVENOUS

## 2019-11-02 MED ORDER — CHLORHEXIDINE GLUCONATE 0.12 % MT SOLN
OROMUCOSAL | Status: AC
  Administered 2019-11-02: 15 mL via OROMUCOSAL
  Filled 2019-11-02: qty 15

## 2019-11-02 MED ORDER — FENTANYL CITRATE (PF) 100 MCG/2ML IJ SOLN
INTRAMUSCULAR | Status: AC
  Filled 2019-11-02: qty 2

## 2019-11-02 MED ORDER — DROPERIDOL 2.5 MG/ML IJ SOLN
0.6250 mg | Freq: Once | INTRAMUSCULAR | Status: DC | PRN
  Filled 2019-11-02: qty 2

## 2019-11-02 MED ORDER — ROCURONIUM BROMIDE 100 MG/10ML IV SOLN
INTRAVENOUS | Status: DC | PRN
  Administered 2019-11-02: 50 mg via INTRAVENOUS

## 2019-11-02 MED ORDER — GLYCOPYRROLATE 0.2 MG/ML IJ SOLN
INTRAMUSCULAR | Status: AC
  Filled 2019-11-02: qty 1

## 2019-11-02 MED ORDER — MIDAZOLAM HCL 2 MG/2ML IJ SOLN
INTRAMUSCULAR | Status: DC | PRN
  Administered 2019-11-02: 2 mg via INTRAVENOUS

## 2019-11-02 MED ORDER — ONDANSETRON HCL 4 MG/2ML IJ SOLN: 4.0000 mg | Freq: Once | INTRAMUSCULAR | Status: DC | PRN

## 2019-11-02 MED ORDER — LIDOCAINE HCL (PF) 2 % IJ SOLN
INTRAMUSCULAR | Status: AC
  Filled 2019-11-02: qty 5

## 2019-11-02 MED ORDER — FENTANYL CITRATE (PF) 100 MCG/2ML IJ SOLN: 50.0000 ug | Freq: Once | INTRAMUSCULAR | Status: AC

## 2019-11-02 MED ORDER — ACETAMINOPHEN 325 MG PO TABS: 650.0000 mg | ORAL_TABLET | ORAL | Status: DC | PRN

## 2019-11-02 MED ORDER — BUPIVACAINE HCL (PF) 0.5 % IJ SOLN
INTRAMUSCULAR | Status: DC | PRN
  Administered 2019-11-02: 10 mL

## 2019-11-02 MED ORDER — ONDANSETRON HCL 4 MG/2ML IJ SOLN
INTRAMUSCULAR | Status: AC
  Filled 2019-11-02: qty 2

## 2019-11-02 SURGICAL SUPPLY — 43 items
BLADE SURG SZ11 CARB STEEL (BLADE) ×3 IMPLANT
CANISTER SUCT 1200ML W/VALVE (MISCELLANEOUS) ×3 IMPLANT
CHLORAPREP W/TINT 26 (MISCELLANEOUS) ×3 IMPLANT
COVER WAND RF STERILE (DRAPES) IMPLANT
DERMABOND ADVANCED (GAUZE/BANDAGES/DRESSINGS) ×2
DERMABOND ADVANCED .7 DNX12 (GAUZE/BANDAGES/DRESSINGS) ×1 IMPLANT
DRSG TELFA 4X3 1S NADH ST (GAUZE/BANDAGES/DRESSINGS) IMPLANT
GAUZE 4X4 16PLY RFD (DISPOSABLE) ×3 IMPLANT
GLOVE BIO SURGEON STRL SZ8 (GLOVE) ×6 IMPLANT
GLOVE INDICATOR 8.0 STRL GRN (GLOVE) ×3 IMPLANT
GOWN STRL REUS W/ TWL LRG LVL3 (GOWN DISPOSABLE) ×1 IMPLANT
GOWN STRL REUS W/ TWL XL LVL3 (GOWN DISPOSABLE) ×1 IMPLANT
GOWN STRL REUS W/TWL LRG LVL3 (GOWN DISPOSABLE) ×2
GOWN STRL REUS W/TWL XL LVL3 (GOWN DISPOSABLE) ×2
GRASPER SUT TROCAR 14GX15 (MISCELLANEOUS) ×3 IMPLANT
IRRIGATION STRYKERFLOW (MISCELLANEOUS) IMPLANT
IRRIGATOR STRYKERFLOW (MISCELLANEOUS)
IV LACTATED RINGERS 1000ML (IV SOLUTION) IMPLANT
KIT PINK PAD W/HEAD ARE REST (MISCELLANEOUS) ×3
KIT PINK PAD W/HEAD ARM REST (MISCELLANEOUS) ×1 IMPLANT
LABEL OR SOLS (LABEL) ×3 IMPLANT
NEEDLE VERESS 14GA 120MM (NEEDLE) ×3 IMPLANT
NS IRRIG 500ML POUR BTL (IV SOLUTION) ×3 IMPLANT
PACK GYN LAPAROSCOPIC (MISCELLANEOUS) ×3 IMPLANT
PAD PREP 24X41 OB/GYN DISP (PERSONAL CARE ITEMS) ×3 IMPLANT
POUCH SPECIMEN RETRIEVAL 10MM (ENDOMECHANICALS) IMPLANT
SCISSORS METZENBAUM CVD 33 (INSTRUMENTS) ×3 IMPLANT
SET TUBE SMOKE EVAC HIGH FLOW (TUBING) ×3 IMPLANT
SHEARS HARMONIC ACE PLUS 36CM (ENDOMECHANICALS) IMPLANT
SLEEVE ENDOPATH XCEL 5M (ENDOMECHANICALS) IMPLANT
SOL PREP PROV IODINE SCRUB 4OZ (MISCELLANEOUS) ×3 IMPLANT
SPONGE GAUZE 2X2 8PLY STER LF (GAUZE/BANDAGES/DRESSINGS)
SPONGE GAUZE 2X2 8PLY STRL LF (GAUZE/BANDAGES/DRESSINGS) IMPLANT
STRAP SAFETY 5IN WIDE (MISCELLANEOUS) ×3 IMPLANT
SUT RETRIEVER MED (INSTRUMENTS) ×3 IMPLANT
SUT VIC AB 0 CT1 36 (SUTURE) ×3 IMPLANT
SUT VIC AB 2-0 UR6 27 (SUTURE) IMPLANT
SUT VIC AB 4-0 PS2 18 (SUTURE) IMPLANT
SYR 10ML LL (SYRINGE) ×3 IMPLANT
SYSTEM WECK SHIELD CLOSURE (TROCAR) IMPLANT
TRAY FOLEY MTR SLVR 16FR STAT (SET/KITS/TRAYS/PACK) ×3 IMPLANT
TROCAR ENDO BLADELESS 11MM (ENDOMECHANICALS) IMPLANT
TROCAR XCEL NON-BLD 5MMX100MML (ENDOMECHANICALS) ×3 IMPLANT

## 2019-11-02 NOTE — Anesthesia Procedure Notes (Signed)
Procedure Name: Intubation Date/Time: 11/02/2019 1:46 PM Performed by: Eben Burow, CRNA Pre-anesthesia Checklist: Patient identified, Emergency Drugs available, Suction available and Patient being monitored Patient Re-evaluated:Patient Re-evaluated prior to induction Oxygen Delivery Method: Circle system utilized Preoxygenation: Pre-oxygenation with 100% oxygen Induction Type: IV induction Ventilation: Mask ventilation without difficulty Laryngoscope Size: Miller and 2 Grade View: Grade I Tube type: Oral Tube size: 7.0 mm Number of attempts: 1 Airway Equipment and Method: Stylet Placement Confirmation: ETT inserted through vocal cords under direct vision,  positive ETCO2 and breath sounds checked- equal and bilateral Secured at: 21 cm Tube secured with: Tape Dental Injury: Teeth and Oropharynx as per pre-operative assessment

## 2019-11-02 NOTE — Interval H&P Note (Signed)
History and Physical Interval Note:  11/02/2019 1:10 PM  Brandi Gonzales  has presented today for surgery, with the diagnosis of Adnexal mass N94.89 RLQ abdominal pain R10.31.  The various methods of treatment have been discussed with the patient and family. After consideration of risks, benefits and other options for treatment, the patient has consented to  Procedure(s): LAPAROSCOPIC BILATERAL SALPINGO OOPHORECTOMY (Bilateral) as a surgical intervention.  The patient's history has been reviewed, patient examined, no change in status, stable for surgery.  I have reviewed the patient's chart and labs.  Questions were answered to the patient's satisfaction.     Hoyt Koch

## 2019-11-02 NOTE — Anesthesia Preprocedure Evaluation (Addendum)
Anesthesia Evaluation  Patient identified by MRN, date of birth, ID band Patient awake    Reviewed: Allergy & Precautions, H&P , NPO status , reviewed documented beta blocker date and time   Airway Mallampati: II  TM Distance: >3 FB     Dental  (+) Poor Dentition, Missing   Pulmonary Current Smoker and Patient abstained from smoking.,    Pulmonary exam normal        Cardiovascular Normal cardiovascular exam     Neuro/Psych PSYCHIATRIC DISORDERS Anxiety Depression    GI/Hepatic PUD, GERD  Medicated and Controlled,  Endo/Other    Renal/GU      Musculoskeletal   Abdominal   Peds  Hematology   Anesthesia Other Findings Past Medical History: No date: Acid reflux No date: Alcoholism, chronic ([place]) No date: Anxiety No date: Depression Past Surgical History: 11/17/2015: LAPAROTOMY; N/A     Comment:  Procedure: EXPLORATORY LAPAROTOMY, GRAHAM PATCH ANTRAL               ULCER;  Surgeon: Leighton Ruff, MD;  Location: [facility];                Service: General;  Laterality: N/A; No date: TUBAL LIGATION BMI    Body Mass Index: 22.31 kg/m     Reproductive/Obstetrics                            Anesthesia Physical Anesthesia Plan  ASA: II  Anesthesia Plan: General   Post-op Pain Management:    Induction: Intravenous  PONV Risk Score and Plan: Ondansetron and Treatment may vary due to age or medical condition  Airway Management Planned: Oral ETT  Additional Equipment:   Intra-op Plan:   Post-operative Plan: Extubation in OR  Informed Consent: I have reviewed the patients History and Physical, chart, labs and discussed the procedure including the risks, benefits and alternatives for the proposed anesthesia with the patient or authorized representative who has indicated his/her understanding and acceptance.     Dental Advisory Given  Plan Discussed with: CRNA  Anesthesia Plan Comments:          Anesthesia Quick Evaluation

## 2019-11-02 NOTE — Op Note (Signed)
Operative Note:  PRE-OP DIAGNOSIS: Adnexal mass N94.89 RLQ abdominal pain R10.31   POST-OP DIAGNOSIS: Adnexal mass N94.89 RLQ abdominal pain R10.31   PROCEDURE: Procedure(s): LAPAROSCOPIC BILATERAL SALPINGO OOPHORECTOMY  SURGEON: Barnett Applebaum, MD, FACOG  ANESTHESIA: General endotracheal anesthesia  ESTIMATED BLOOD LOSS: less than 50   SPECIMENS: Tubes and Ovaries.  COMPLICATIONS: None  DISPOSITION: stable to PACU  FINDINGS: Intraabdominal adhesions were not noted.   PROCEDURE IN DETAIL: The patient was taken to the OR where anesthesia was administed. The patient was positioned supine. Foley placed.  Attention was turned to the patients abdomen where a 11 mm skin incision was made in the umbilical fold, after injection of local anesthesia. The Veress step needle was carefully introduced into the peritoneal cavity with placement confirmed using the hanging drop technique. Pneumoperitoneum was obtained. The 11 mm port was then placed under direct visualization with the operative laparoscope The above noted findings. Trendelenburg.  5 mm trocars were then placed in the LLQ and RLQ lateral to the inferior epigastric blood vessels under direct visualization with the laparoscope. Enlarged firm right ovary noted, no other pathology surrounding it.  Right and left fallopian tubes/ovaries are identified and the ovarian blood vessels/infundibulopelvic ligament pedicles are coagulated and ligated using the Harmonic scapel. Dissection is carried out to the uterine-ovarian blood vessels which also are transected for amputation of each adnexa.They are removed through a specimen bag. No injuries or bleeding was noted.  All instruments and ports were then removed from the abdomen after gas was expelled and patient was leveled.The umbilical fascia is closed with a 0 Vicryl Suture.  The skin here is closed with a 4-0 Vicryl Suture. All skin sites then closed with skin adhesive. The foley catheter  was removed. The patient tolerated the procedure well. All counts were correct x 2. The patient was transferred to the recovery room awake, alert and breathing independently.  Barnett Applebaum, MD, [facility], [facility] 11/02/2019  3:01 PM

## 2019-11-02 NOTE — Discharge Instructions (Addendum)
Bilateral Salpingo-Oophorectomy, Care After This sheet gives you information about how to care for yourself after your procedure. Your health care provider may also give you more specific instructions. If you have problems or questions, contact your health care provider. What can I expect after the procedure? After the procedure, it is common to have:  Abdominal pain.  Some occasional vaginal bleeding (spotting).  Tiredness.  Symptoms of menopause, such as hot flashes, night sweats, or mood swings. Follow these instructions at home: Incision care   Keep your incision area and your bandage (dressing) clean and dry.  Follow instructions from your health care provider about how to take care of your incision. Make sure you: ? Wash your hands with soap and water before you change your dressing. If soap and water are not available, use hand sanitizer. ? Change your dressing as told by your health care provider. ? Leave stitches (sutures), staples, skin glue, or adhesive strips in place. These skin closures may need to stay in place for 2 weeks or longer. If adhesive strip edges start to loosen and curl up, you may trim the loose edges. Do not remove adhesive strips completely unless your health care provider tells you to do that.  Check your incision area every day for signs of infection. Check for: ? Redness, swelling, or pain. ? Fluid or blood. ? Warmth. ? Pus or a bad smell. Activity   Do not drive or use heavy machinery while taking prescription pain medicine.  Do not drive for 24 hours if you received a medicine to help you relax (sedative) during your procedure.  Take frequent, short walks throughout the day. Rest when you get tired. Ask your health care provider what activities are safe for you.  Avoid activity that requires great effort. Also, avoid heavy lifting. Do not lift anything that is heavier than 10 lbs. (4.5 kg), or the limit that your health care provider tells you,  until he or she says that it is safe to do so.  Do not douche, use tampons, or have sex until your health care provider approves. General instructions   To prevent or treat constipation while you are taking prescription pain medicine, your health care provider may recommend that you: ? Drink enough fluid to keep your urine clear or pale yellow. ? Take over-the-counter or prescription medicines. ? Eat foods that are high in fiber, such as fresh fruits and vegetables, whole grains, and beans. ? Limit foods that are high in fat and processed sugars, such as fried and sweet foods.  Take over-the-counter and prescription medicines only as told by your health care provider.  Do not take baths, swim, or use a hot tub until your health care provider approves. Ask your health care provider if you can take showers. You may only be allowed to take sponge baths for bathing.  Wear compression stockings as told by your health care provider. These stockings help to prevent blood clots and reduce swelling in your legs.  Keep all follow-up visits as told by your health care provider. This is important. Contact a health care provider if:  You have pain when you urinate.  You have pus or a bad smelling discharge coming from your vagina.  You have redness, swelling, or pain around your incision.  You have fluid or blood coming from your incision.  Your incision feels warm to the touch.  You have pus or a bad smell coming from your incision.  You have a fever.  Your incision starts to break open.  You have pain in the abdomen, and it gets worse or does not get better when you take medicine.  You develop a rash.  You develop nausea and vomiting.  You feel lightheaded. Get help right away if:  You develop pain in your chest or leg.  You become short of breath.  You faint.  You have increased bleeding from your vagina. Summary  After the procedure, it is common to have pain, bleeding  in the vagina, and symptoms of menopause.  Follow instructions from your health care provider about how to take care of your incision.  Follow instructions from your health care provider about activities and restrictions.  Check your incision every day for signs of infection and report any symptoms to your health care provider. This information is not intended to replace advice given to you by your health care provider. Make sure you discuss any questions you have with your health care provider. Document Revised: 05/13/2018 Document Reviewed: 04/13/2016 Elsevier Patient Education  2020 [place]   1) The drugs that you were given will stay in your system until tomorrow so for the next 24 hours you should not:  A) Drive an automobile B) Make any legal decisions C) Drink any alcoholic beverage   2) You may resume regular meals tomorrow.  Today it is better to start with liquids and gradually work up to solid foods.  You may eat anything you prefer, but it is better to start with liquids, then soup and crackers, and gradually work up to solid foods.   3) Please notify your doctor immediately if you have any unusual bleeding, trouble breathing, redness and pain at the surgery site, drainage, fever, or pain not relieved by medication.    4) Additional Instructions:        Please contact your physician with any problems or Same Day Surgery at [phone], Monday through Friday 6 am to 4 pm, or [place] at [facility] number at [phone].

## 2019-11-02 NOTE — Transfer of Care (Signed)
Immediate Anesthesia Transfer of Care Note  Patient: Brandi Gonzales  Procedure(s) Performed: LAPAROSCOPIC BILATERAL SALPINGO OOPHORECTOMY (Bilateral )  Patient Location: PACU  Anesthesia Type:General  Level of Consciousness: drowsy  Airway & Oxygen Therapy: Patient Spontanous Breathing and Patient connected to face mask oxygen  Post-op Assessment: Report given to RN and Post -op Vital signs reviewed and stable  Post vital signs: Reviewed and stable  Last Vitals:  Vitals Value Taken Time  BP 127/71 11/02/19 1454  Temp 36.2 C 11/02/19 1454  Pulse 78 11/02/19 1456  Resp 11 11/02/19 1456  SpO2 100 % 11/02/19 1456  Vitals shown include unvalidated device data.  Last Pain:  Vitals:   11/02/19 1225  TempSrc: Oral  PainSc: 0-No pain         Complications: No complications documented.

## 2019-11-03 ENCOUNTER — Encounter: Payer: Self-pay | Admitting: Obstetrics & Gynecology

## 2019-11-03 NOTE — Anesthesia Postprocedure Evaluation (Signed)
Anesthesia Post Note  Patient: Brandi Gonzales  Procedure(s) Performed: LAPAROSCOPIC BILATERAL SALPINGO OOPHORECTOMY (Bilateral )  Patient location during evaluation: PACU Anesthesia Type: General Level of consciousness: awake and alert Pain management: pain level controlled Vital Signs Assessment: post-procedure vital signs reviewed and stable Respiratory status: spontaneous breathing, nonlabored ventilation and respiratory function stable Cardiovascular status: blood pressure returned to baseline and stable Postop Assessment: no apparent nausea or vomiting Anesthetic complications: no   No complications documented.   Last Vitals:  Vitals:   11/02/19 1615 11/02/19 1633  BP: (!) 147/66 (!) 143/62  Pulse: 65 72  Resp: 16 16  Temp: 36.4 C   SpO2: 100% 98%    Last Pain:  Vitals:   11/02/19 1633  TempSrc:   PainSc: 5                  Jolinda Pinkstaff Harvie Heck

## 2019-11-06 ENCOUNTER — Telehealth: Payer: Self-pay

## 2019-11-06 LAB — SURGICAL PATHOLOGY

## 2019-11-06 NOTE — Telephone Encounter (Signed)
Pt calling for refill of pain medication; is still having pain.  [phone]

## 2019-11-07 NOTE — Telephone Encounter (Signed)
Pt called this morning to check on the status of the pain medication request. I passed along the advise from Dr. Gilman Schmidt. Pt stated she has had to double up on the medication. She has declined an office visit stating she does not live close by. She stated she would have to got an urgent care.

## 2019-11-07 NOTE — Telephone Encounter (Signed)
No, she was prescribed 30 tablets 5 days ago- if having severe pain would recommend seeing her in office or at the ER.

## 2019-11-07 NOTE — Telephone Encounter (Signed)
Error

## 2019-11-17 ENCOUNTER — Ambulatory Visit: Payer: Medicaid Other | Admitting: Obstetrics & Gynecology

## 2019-11-24 NOTE — Telephone Encounter (Signed)
This has been taken care of.

## 2020-07-02 DIAGNOSIS — Z87891 Personal history of nicotine dependence: Secondary | ICD-10-CM | POA: Diagnosis not present

## 2020-07-02 DIAGNOSIS — R1011 Right upper quadrant pain: Secondary | ICD-10-CM | POA: Diagnosis not present

## 2020-07-02 DIAGNOSIS — G8929 Other chronic pain: Secondary | ICD-10-CM | POA: Diagnosis not present

## 2020-07-02 DIAGNOSIS — Z8619 Personal history of other infectious and parasitic diseases: Secondary | ICD-10-CM | POA: Diagnosis not present

## 2020-07-02 DIAGNOSIS — G4700 Insomnia, unspecified: Secondary | ICD-10-CM | POA: Diagnosis not present

## 2020-07-02 DIAGNOSIS — K219 Gastro-esophageal reflux disease without esophagitis: Secondary | ICD-10-CM | POA: Diagnosis not present

## 2020-07-02 DIAGNOSIS — M62830 Muscle spasm of back: Secondary | ICD-10-CM | POA: Diagnosis not present

## 2020-07-06 DIAGNOSIS — K8689 Other specified diseases of pancreas: Secondary | ICD-10-CM | POA: Diagnosis not present

## 2020-07-06 DIAGNOSIS — R110 Nausea: Secondary | ICD-10-CM | POA: Diagnosis not present

## 2020-07-06 DIAGNOSIS — K449 Diaphragmatic hernia without obstruction or gangrene: Secondary | ICD-10-CM | POA: Diagnosis not present

## 2020-07-06 DIAGNOSIS — G8929 Other chronic pain: Secondary | ICD-10-CM | POA: Diagnosis not present

## 2020-07-06 DIAGNOSIS — E278 Other specified disorders of adrenal gland: Secondary | ICD-10-CM | POA: Diagnosis not present

## 2020-07-06 DIAGNOSIS — Z872 Personal history of diseases of the skin and subcutaneous tissue: Secondary | ICD-10-CM | POA: Diagnosis not present

## 2020-07-06 DIAGNOSIS — R1011 Right upper quadrant pain: Secondary | ICD-10-CM | POA: Diagnosis not present

## 2020-07-06 DIAGNOSIS — K769 Liver disease, unspecified: Secondary | ICD-10-CM | POA: Diagnosis not present

## 2020-07-08 DIAGNOSIS — R160 Hepatomegaly, not elsewhere classified: Secondary | ICD-10-CM | POA: Diagnosis not present

## 2020-07-08 DIAGNOSIS — C259 Malignant neoplasm of pancreas, unspecified: Secondary | ICD-10-CM | POA: Diagnosis not present

## 2020-07-08 DIAGNOSIS — R1013 Epigastric pain: Secondary | ICD-10-CM | POA: Diagnosis not present

## 2020-07-09 DIAGNOSIS — Z800 Family history of malignant neoplasm of digestive organs: Secondary | ICD-10-CM | POA: Diagnosis not present

## 2020-07-09 DIAGNOSIS — K8689 Other specified diseases of pancreas: Secondary | ICD-10-CM | POA: Diagnosis not present

## 2020-07-09 DIAGNOSIS — C787 Secondary malignant neoplasm of liver and intrahepatic bile duct: Secondary | ICD-10-CM | POA: Diagnosis not present

## 2020-07-09 DIAGNOSIS — R160 Hepatomegaly, not elsewhere classified: Secondary | ICD-10-CM | POA: Diagnosis not present

## 2020-07-09 DIAGNOSIS — K5900 Constipation, unspecified: Secondary | ICD-10-CM | POA: Diagnosis not present

## 2020-07-09 DIAGNOSIS — E871 Hypo-osmolality and hyponatremia: Secondary | ICD-10-CM | POA: Diagnosis not present

## 2020-07-09 DIAGNOSIS — Z20822 Contact with and (suspected) exposure to covid-19: Secondary | ICD-10-CM | POA: Diagnosis not present

## 2020-07-09 DIAGNOSIS — C259 Malignant neoplasm of pancreas, unspecified: Secondary | ICD-10-CM | POA: Diagnosis not present

## 2020-07-09 DIAGNOSIS — Z801 Family history of malignant neoplasm of trachea, bronchus and lung: Secondary | ICD-10-CM | POA: Diagnosis not present

## 2020-07-09 DIAGNOSIS — R109 Unspecified abdominal pain: Secondary | ICD-10-CM | POA: Diagnosis not present

## 2020-07-09 DIAGNOSIS — R918 Other nonspecific abnormal finding of lung field: Secondary | ICD-10-CM | POA: Diagnosis not present

## 2020-07-09 DIAGNOSIS — H409 Unspecified glaucoma: Secondary | ICD-10-CM | POA: Diagnosis not present

## 2020-07-09 DIAGNOSIS — Z8711 Personal history of peptic ulcer disease: Secondary | ICD-10-CM | POA: Diagnosis not present

## 2020-07-09 DIAGNOSIS — J9601 Acute respiratory failure with hypoxia: Secondary | ICD-10-CM | POA: Diagnosis not present

## 2020-07-09 DIAGNOSIS — F17210 Nicotine dependence, cigarettes, uncomplicated: Secondary | ICD-10-CM | POA: Diagnosis not present

## 2020-07-09 DIAGNOSIS — R112 Nausea with vomiting, unspecified: Secondary | ICD-10-CM | POA: Diagnosis not present

## 2020-07-09 DIAGNOSIS — D6869 Other thrombophilia: Secondary | ICD-10-CM | POA: Diagnosis not present

## 2020-07-09 DIAGNOSIS — C251 Malignant neoplasm of body of pancreas: Secondary | ICD-10-CM | POA: Diagnosis not present

## 2020-07-09 DIAGNOSIS — R110 Nausea: Secondary | ICD-10-CM | POA: Diagnosis not present

## 2020-07-09 DIAGNOSIS — R1013 Epigastric pain: Secondary | ICD-10-CM | POA: Diagnosis not present

## 2020-07-09 DIAGNOSIS — G893 Neoplasm related pain (acute) (chronic): Secondary | ICD-10-CM | POA: Diagnosis not present

## 2020-07-09 DIAGNOSIS — Z515 Encounter for palliative care: Secondary | ICD-10-CM | POA: Diagnosis not present

## 2020-07-09 DIAGNOSIS — K869 Disease of pancreas, unspecified: Secondary | ICD-10-CM | POA: Diagnosis not present

## 2020-07-09 DIAGNOSIS — K219 Gastro-esophageal reflux disease without esophagitis: Secondary | ICD-10-CM | POA: Diagnosis not present

## 2020-07-09 DIAGNOSIS — K769 Liver disease, unspecified: Secondary | ICD-10-CM | POA: Diagnosis not present

## 2020-07-09 DIAGNOSIS — R1011 Right upper quadrant pain: Secondary | ICD-10-CM | POA: Diagnosis not present

## 2020-07-09 DIAGNOSIS — E875 Hyperkalemia: Secondary | ICD-10-CM | POA: Diagnosis not present

## 2020-07-19 DIAGNOSIS — C787 Secondary malignant neoplasm of liver and intrahepatic bile duct: Secondary | ICD-10-CM | POA: Diagnosis not present

## 2020-07-19 DIAGNOSIS — K8689 Other specified diseases of pancreas: Secondary | ICD-10-CM | POA: Diagnosis not present

## 2020-07-19 DIAGNOSIS — C7989 Secondary malignant neoplasm of other specified sites: Secondary | ICD-10-CM | POA: Diagnosis not present

## 2020-07-19 DIAGNOSIS — Z515 Encounter for palliative care: Secondary | ICD-10-CM | POA: Diagnosis not present

## 2020-07-19 DIAGNOSIS — C259 Malignant neoplasm of pancreas, unspecified: Secondary | ICD-10-CM | POA: Diagnosis not present

## 2020-07-19 DIAGNOSIS — R110 Nausea: Secondary | ICD-10-CM | POA: Diagnosis not present

## 2020-07-19 DIAGNOSIS — G893 Neoplasm related pain (acute) (chronic): Secondary | ICD-10-CM | POA: Diagnosis not present

## 2020-07-24 DIAGNOSIS — C259 Malignant neoplasm of pancreas, unspecified: Secondary | ICD-10-CM | POA: Diagnosis not present

## 2020-07-24 DIAGNOSIS — Z888 Allergy status to other drugs, medicaments and biological substances status: Secondary | ICD-10-CM | POA: Diagnosis not present

## 2020-07-24 DIAGNOSIS — Z452 Encounter for adjustment and management of vascular access device: Secondary | ICD-10-CM | POA: Diagnosis not present

## 2020-07-24 DIAGNOSIS — Z886 Allergy status to analgesic agent status: Secondary | ICD-10-CM | POA: Diagnosis not present

## 2020-07-31 DIAGNOSIS — C259 Malignant neoplasm of pancreas, unspecified: Secondary | ICD-10-CM | POA: Diagnosis not present

## 2020-07-31 DIAGNOSIS — C787 Secondary malignant neoplasm of liver and intrahepatic bile duct: Secondary | ICD-10-CM | POA: Diagnosis not present

## 2020-07-31 DIAGNOSIS — Z5111 Encounter for antineoplastic chemotherapy: Secondary | ICD-10-CM | POA: Diagnosis not present

## 2020-07-31 DIAGNOSIS — Z713 Dietary counseling and surveillance: Secondary | ICD-10-CM | POA: Diagnosis not present

## 2020-07-31 DIAGNOSIS — T451X5A Adverse effect of antineoplastic and immunosuppressive drugs, initial encounter: Secondary | ICD-10-CM | POA: Diagnosis not present

## 2020-07-31 DIAGNOSIS — R112 Nausea with vomiting, unspecified: Secondary | ICD-10-CM | POA: Diagnosis not present

## 2020-08-03 DIAGNOSIS — C259 Malignant neoplasm of pancreas, unspecified: Secondary | ICD-10-CM | POA: Diagnosis not present

## 2020-08-03 DIAGNOSIS — C787 Secondary malignant neoplasm of liver and intrahepatic bile duct: Secondary | ICD-10-CM | POA: Diagnosis not present

## 2020-08-11 DIAGNOSIS — J9601 Acute respiratory failure with hypoxia: Secondary | ICD-10-CM | POA: Diagnosis not present

## 2020-08-14 DIAGNOSIS — G893 Neoplasm related pain (acute) (chronic): Secondary | ICD-10-CM | POA: Diagnosis not present

## 2020-08-14 DIAGNOSIS — C259 Malignant neoplasm of pancreas, unspecified: Secondary | ICD-10-CM | POA: Diagnosis not present

## 2020-08-14 DIAGNOSIS — Z5111 Encounter for antineoplastic chemotherapy: Secondary | ICD-10-CM | POA: Diagnosis not present

## 2020-08-14 DIAGNOSIS — C787 Secondary malignant neoplasm of liver and intrahepatic bile duct: Secondary | ICD-10-CM | POA: Diagnosis not present

## 2020-08-14 DIAGNOSIS — Z79899 Other long term (current) drug therapy: Secondary | ICD-10-CM | POA: Diagnosis not present

## 2020-08-17 DIAGNOSIS — C787 Secondary malignant neoplasm of liver and intrahepatic bile duct: Secondary | ICD-10-CM | POA: Diagnosis not present

## 2020-08-17 DIAGNOSIS — C259 Malignant neoplasm of pancreas, unspecified: Secondary | ICD-10-CM | POA: Diagnosis not present

## 2020-08-18 DIAGNOSIS — T782XXA Anaphylactic shock, unspecified, initial encounter: Secondary | ICD-10-CM | POA: Diagnosis not present

## 2020-08-18 DIAGNOSIS — F17210 Nicotine dependence, cigarettes, uncomplicated: Secondary | ICD-10-CM | POA: Diagnosis not present

## 2020-08-18 DIAGNOSIS — R112 Nausea with vomiting, unspecified: Secondary | ICD-10-CM | POA: Diagnosis not present

## 2020-08-18 DIAGNOSIS — R000 Tachycardia, unspecified: Secondary | ICD-10-CM | POA: Diagnosis not present

## 2020-08-18 DIAGNOSIS — Z20822 Contact with and (suspected) exposure to covid-19: Secondary | ICD-10-CM | POA: Diagnosis not present

## 2020-08-18 DIAGNOSIS — C259 Malignant neoplasm of pancreas, unspecified: Secondary | ICD-10-CM | POA: Diagnosis not present

## 2020-08-18 DIAGNOSIS — R110 Nausea: Secondary | ICD-10-CM | POA: Diagnosis not present

## 2020-08-27 DIAGNOSIS — T402X5A Adverse effect of other opioids, initial encounter: Secondary | ICD-10-CM | POA: Diagnosis not present

## 2020-08-27 DIAGNOSIS — G893 Neoplasm related pain (acute) (chronic): Secondary | ICD-10-CM | POA: Diagnosis not present

## 2020-08-27 DIAGNOSIS — Z515 Encounter for palliative care: Secondary | ICD-10-CM | POA: Diagnosis not present

## 2020-08-27 DIAGNOSIS — C259 Malignant neoplasm of pancreas, unspecified: Secondary | ICD-10-CM | POA: Diagnosis not present

## 2020-08-27 DIAGNOSIS — C787 Secondary malignant neoplasm of liver and intrahepatic bile duct: Secondary | ICD-10-CM | POA: Diagnosis not present

## 2020-08-27 DIAGNOSIS — K5903 Drug induced constipation: Secondary | ICD-10-CM | POA: Diagnosis not present

## 2020-09-11 DIAGNOSIS — J9601 Acute respiratory failure with hypoxia: Secondary | ICD-10-CM | POA: Diagnosis not present

## 2020-09-13 DIAGNOSIS — E871 Hypo-osmolality and hyponatremia: Secondary | ICD-10-CM | POA: Diagnosis not present

## 2020-09-13 DIAGNOSIS — R600 Localized edema: Secondary | ICD-10-CM | POA: Diagnosis not present

## 2020-09-13 DIAGNOSIS — F17210 Nicotine dependence, cigarettes, uncomplicated: Secondary | ICD-10-CM | POA: Diagnosis not present

## 2020-09-13 DIAGNOSIS — G893 Neoplasm related pain (acute) (chronic): Secondary | ICD-10-CM | POA: Diagnosis not present

## 2020-09-13 DIAGNOSIS — Z5111 Encounter for antineoplastic chemotherapy: Secondary | ICD-10-CM | POA: Diagnosis not present

## 2020-09-13 DIAGNOSIS — C787 Secondary malignant neoplasm of liver and intrahepatic bile duct: Secondary | ICD-10-CM | POA: Diagnosis not present

## 2020-09-13 DIAGNOSIS — E876 Hypokalemia: Secondary | ICD-10-CM | POA: Diagnosis not present

## 2020-09-13 DIAGNOSIS — K8689 Other specified diseases of pancreas: Secondary | ICD-10-CM | POA: Diagnosis not present

## 2020-09-13 DIAGNOSIS — C259 Malignant neoplasm of pancreas, unspecified: Secondary | ICD-10-CM | POA: Diagnosis not present

## 2020-10-01 DIAGNOSIS — E279 Disorder of adrenal gland, unspecified: Secondary | ICD-10-CM | POA: Diagnosis not present

## 2020-10-01 DIAGNOSIS — C259 Malignant neoplasm of pancreas, unspecified: Secondary | ICD-10-CM | POA: Diagnosis not present

## 2020-10-01 DIAGNOSIS — C800 Disseminated malignant neoplasm, unspecified: Secondary | ICD-10-CM | POA: Diagnosis not present

## 2020-10-01 DIAGNOSIS — R918 Other nonspecific abnormal finding of lung field: Secondary | ICD-10-CM | POA: Diagnosis not present

## 2020-10-01 DIAGNOSIS — J432 Centrilobular emphysema: Secondary | ICD-10-CM | POA: Diagnosis not present

## 2020-10-01 DIAGNOSIS — C787 Secondary malignant neoplasm of liver and intrahepatic bile duct: Secondary | ICD-10-CM | POA: Diagnosis not present

## 2020-10-01 DIAGNOSIS — R590 Localized enlarged lymph nodes: Secondary | ICD-10-CM | POA: Diagnosis not present

## 2020-10-01 DIAGNOSIS — C772 Secondary and unspecified malignant neoplasm of intra-abdominal lymph nodes: Secondary | ICD-10-CM | POA: Diagnosis not present

## 2020-10-02 DIAGNOSIS — Z800 Family history of malignant neoplasm of digestive organs: Secondary | ICD-10-CM | POA: Diagnosis not present

## 2020-10-02 DIAGNOSIS — C259 Malignant neoplasm of pancreas, unspecified: Secondary | ICD-10-CM | POA: Diagnosis not present

## 2020-10-02 DIAGNOSIS — R140 Abdominal distension (gaseous): Secondary | ICD-10-CM | POA: Diagnosis not present

## 2020-10-02 DIAGNOSIS — Z886 Allergy status to analgesic agent status: Secondary | ICD-10-CM | POA: Diagnosis not present

## 2020-10-02 DIAGNOSIS — F17210 Nicotine dependence, cigarettes, uncomplicated: Secondary | ICD-10-CM | POA: Diagnosis not present

## 2020-10-02 DIAGNOSIS — C787 Secondary malignant neoplasm of liver and intrahepatic bile duct: Secondary | ICD-10-CM | POA: Diagnosis not present

## 2020-10-02 DIAGNOSIS — R109 Unspecified abdominal pain: Secondary | ICD-10-CM | POA: Diagnosis not present

## 2020-10-02 DIAGNOSIS — R9431 Abnormal electrocardiogram [ECG] [EKG]: Secondary | ICD-10-CM | POA: Diagnosis not present

## 2020-10-02 DIAGNOSIS — R627 Adult failure to thrive: Secondary | ICD-10-CM | POA: Diagnosis not present

## 2020-10-02 DIAGNOSIS — Z802 Family history of malignant neoplasm of other respiratory and intrathoracic organs: Secondary | ICD-10-CM | POA: Diagnosis not present

## 2020-10-02 DIAGNOSIS — Z888 Allergy status to other drugs, medicaments and biological substances status: Secondary | ICD-10-CM | POA: Diagnosis not present

## 2020-10-02 DIAGNOSIS — C786 Secondary malignant neoplasm of retroperitoneum and peritoneum: Secondary | ICD-10-CM | POA: Diagnosis not present

## 2020-10-02 DIAGNOSIS — Z808 Family history of malignant neoplasm of other organs or systems: Secondary | ICD-10-CM | POA: Diagnosis not present

## 2020-10-02 DIAGNOSIS — R600 Localized edema: Secondary | ICD-10-CM | POA: Diagnosis not present

## 2020-10-02 DIAGNOSIS — D6481 Anemia due to antineoplastic chemotherapy: Secondary | ICD-10-CM | POA: Diagnosis not present

## 2020-10-02 DIAGNOSIS — Z5111 Encounter for antineoplastic chemotherapy: Secondary | ICD-10-CM | POA: Diagnosis not present

## 2020-10-02 DIAGNOSIS — G629 Polyneuropathy, unspecified: Secondary | ICD-10-CM | POA: Diagnosis not present

## 2020-10-03 DIAGNOSIS — C787 Secondary malignant neoplasm of liver and intrahepatic bile duct: Secondary | ICD-10-CM | POA: Diagnosis not present

## 2020-10-03 DIAGNOSIS — C259 Malignant neoplasm of pancreas, unspecified: Secondary | ICD-10-CM | POA: Diagnosis not present

## 2020-10-09 DIAGNOSIS — R590 Localized enlarged lymph nodes: Secondary | ICD-10-CM | POA: Diagnosis not present

## 2020-10-09 DIAGNOSIS — E871 Hypo-osmolality and hyponatremia: Secondary | ICD-10-CM | POA: Diagnosis not present

## 2020-10-09 DIAGNOSIS — F32A Depression, unspecified: Secondary | ICD-10-CM | POA: Diagnosis not present

## 2020-10-09 DIAGNOSIS — F17210 Nicotine dependence, cigarettes, uncomplicated: Secondary | ICD-10-CM | POA: Diagnosis not present

## 2020-10-09 DIAGNOSIS — R059 Cough, unspecified: Secondary | ICD-10-CM | POA: Diagnosis not present

## 2020-10-09 DIAGNOSIS — E222 Syndrome of inappropriate secretion of antidiuretic hormone: Secondary | ICD-10-CM | POA: Diagnosis not present

## 2020-10-09 DIAGNOSIS — R531 Weakness: Secondary | ICD-10-CM | POA: Diagnosis not present

## 2020-10-09 DIAGNOSIS — R638 Other symptoms and signs concerning food and fluid intake: Secondary | ICD-10-CM | POA: Diagnosis not present

## 2020-10-09 DIAGNOSIS — J9601 Acute respiratory failure with hypoxia: Secondary | ICD-10-CM | POA: Diagnosis not present

## 2020-10-09 DIAGNOSIS — Z20822 Contact with and (suspected) exposure to covid-19: Secondary | ICD-10-CM | POA: Diagnosis not present

## 2020-10-09 DIAGNOSIS — Z7901 Long term (current) use of anticoagulants: Secondary | ICD-10-CM | POA: Diagnosis not present

## 2020-10-09 DIAGNOSIS — D61810 Antineoplastic chemotherapy induced pancytopenia: Secondary | ICD-10-CM | POA: Diagnosis not present

## 2020-10-09 DIAGNOSIS — K5900 Constipation, unspecified: Secondary | ICD-10-CM | POA: Diagnosis not present

## 2020-10-09 DIAGNOSIS — T451X5A Adverse effect of antineoplastic and immunosuppressive drugs, initial encounter: Secondary | ICD-10-CM | POA: Diagnosis not present

## 2020-10-09 DIAGNOSIS — I2699 Other pulmonary embolism without acute cor pulmonale: Secondary | ICD-10-CM | POA: Diagnosis not present

## 2020-10-09 DIAGNOSIS — K8689 Other specified diseases of pancreas: Secondary | ICD-10-CM | POA: Diagnosis not present

## 2020-10-09 DIAGNOSIS — J90 Pleural effusion, not elsewhere classified: Secondary | ICD-10-CM | POA: Diagnosis not present

## 2020-10-09 DIAGNOSIS — R109 Unspecified abdominal pain: Secondary | ICD-10-CM | POA: Diagnosis not present

## 2020-10-09 DIAGNOSIS — R600 Localized edema: Secondary | ICD-10-CM | POA: Diagnosis not present

## 2020-10-09 DIAGNOSIS — D696 Thrombocytopenia, unspecified: Secondary | ICD-10-CM | POA: Diagnosis not present

## 2020-10-09 DIAGNOSIS — R918 Other nonspecific abnormal finding of lung field: Secondary | ICD-10-CM | POA: Diagnosis not present

## 2020-10-09 DIAGNOSIS — R627 Adult failure to thrive: Secondary | ICD-10-CM | POA: Diagnosis not present

## 2020-10-09 DIAGNOSIS — I2693 Single subsegmental pulmonary embolism without acute cor pulmonale: Secondary | ICD-10-CM | POA: Diagnosis not present

## 2020-10-09 DIAGNOSIS — R140 Abdominal distension (gaseous): Secondary | ICD-10-CM | POA: Diagnosis not present

## 2020-10-09 DIAGNOSIS — R42 Dizziness and giddiness: Secondary | ICD-10-CM | POA: Diagnosis not present

## 2020-10-09 DIAGNOSIS — G479 Sleep disorder, unspecified: Secondary | ICD-10-CM | POA: Diagnosis not present

## 2020-10-09 DIAGNOSIS — R000 Tachycardia, unspecified: Secondary | ICD-10-CM | POA: Diagnosis not present

## 2020-10-09 DIAGNOSIS — C786 Secondary malignant neoplasm of retroperitoneum and peritoneum: Secondary | ICD-10-CM | POA: Diagnosis not present

## 2020-10-09 DIAGNOSIS — G893 Neoplasm related pain (acute) (chronic): Secondary | ICD-10-CM | POA: Diagnosis not present

## 2020-10-09 DIAGNOSIS — C482 Malignant neoplasm of peritoneum, unspecified: Secondary | ICD-10-CM | POA: Diagnosis not present

## 2020-10-09 DIAGNOSIS — M7989 Other specified soft tissue disorders: Secondary | ICD-10-CM | POA: Diagnosis not present

## 2020-10-09 DIAGNOSIS — R1084 Generalized abdominal pain: Secondary | ICD-10-CM | POA: Diagnosis not present

## 2020-10-09 DIAGNOSIS — C7800 Secondary malignant neoplasm of unspecified lung: Secondary | ICD-10-CM | POA: Diagnosis not present

## 2020-10-09 DIAGNOSIS — R609 Edema, unspecified: Secondary | ICD-10-CM | POA: Diagnosis not present

## 2020-10-09 DIAGNOSIS — Z66 Do not resuscitate: Secondary | ICD-10-CM | POA: Diagnosis not present

## 2020-10-09 DIAGNOSIS — R5383 Other fatigue: Secondary | ICD-10-CM | POA: Diagnosis not present

## 2020-10-09 DIAGNOSIS — I313 Pericardial effusion (noninflammatory): Secondary | ICD-10-CM | POA: Diagnosis not present

## 2020-10-09 DIAGNOSIS — R791 Abnormal coagulation profile: Secondary | ICD-10-CM | POA: Diagnosis not present

## 2020-10-09 DIAGNOSIS — R339 Retention of urine, unspecified: Secondary | ICD-10-CM | POA: Diagnosis not present

## 2020-10-09 DIAGNOSIS — R0600 Dyspnea, unspecified: Secondary | ICD-10-CM | POA: Diagnosis not present

## 2020-10-09 DIAGNOSIS — M6281 Muscle weakness (generalized): Secondary | ICD-10-CM | POA: Diagnosis not present

## 2020-10-09 DIAGNOSIS — K219 Gastro-esophageal reflux disease without esophagitis: Secondary | ICD-10-CM | POA: Diagnosis not present

## 2020-10-09 DIAGNOSIS — D649 Anemia, unspecified: Secondary | ICD-10-CM | POA: Diagnosis not present

## 2020-10-09 DIAGNOSIS — Z515 Encounter for palliative care: Secondary | ICD-10-CM | POA: Diagnosis not present

## 2020-10-09 DIAGNOSIS — R0602 Shortness of breath: Secondary | ICD-10-CM | POA: Diagnosis not present

## 2020-10-09 DIAGNOSIS — R0902 Hypoxemia: Secondary | ICD-10-CM | POA: Diagnosis not present

## 2020-10-09 DIAGNOSIS — C787 Secondary malignant neoplasm of liver and intrahepatic bile duct: Secondary | ICD-10-CM | POA: Diagnosis not present

## 2020-10-09 DIAGNOSIS — Z7409 Other reduced mobility: Secondary | ICD-10-CM | POA: Diagnosis not present

## 2020-10-09 DIAGNOSIS — E440 Moderate protein-calorie malnutrition: Secondary | ICD-10-CM | POA: Diagnosis not present

## 2020-10-09 DIAGNOSIS — C254 Malignant neoplasm of endocrine pancreas: Secondary | ICD-10-CM | POA: Diagnosis not present

## 2020-10-09 DIAGNOSIS — C259 Malignant neoplasm of pancreas, unspecified: Secondary | ICD-10-CM | POA: Diagnosis not present

## 2020-11-21 DEATH — deceased
